# Patient Record
Sex: Male | Born: 1940 | Race: White | Hispanic: No | Marital: Married | Smoking: Never smoker
Health system: Southern US, Community
[De-identification: ages and names within clinical notes are randomized; demographics above are authoritative.]

## PROBLEM LIST (undated history)

## (undated) DIAGNOSIS — I10 Essential (primary) hypertension: Secondary | ICD-10-CM

## (undated) DIAGNOSIS — E7800 Pure hypercholesterolemia, unspecified: Secondary | ICD-10-CM

## (undated) HISTORY — DX: Essential (primary) hypertension: I10

## (undated) HISTORY — DX: Pure hypercholesterolemia, unspecified: E78.00

---

## 2006-08-22 ENCOUNTER — Inpatient Hospital Stay (HOSPITAL_COMMUNITY): Admission: RE | Admit: 2006-08-22 | Discharge: 2006-08-31 | Payer: Self-pay | Admitting: Psychiatry

## 2006-08-22 ENCOUNTER — Emergency Department (HOSPITAL_COMMUNITY): Admission: EM | Admit: 2006-08-22 | Discharge: 2006-08-22 | Payer: Self-pay | Admitting: Emergency Medicine

## 2006-08-22 ENCOUNTER — Ambulatory Visit: Payer: Self-pay | Admitting: Psychiatry

## 2010-07-08 ENCOUNTER — Encounter: Payer: Self-pay | Admitting: Family Medicine

## 2013-05-17 ENCOUNTER — Other Ambulatory Visit: Payer: Self-pay | Admitting: Family Medicine

## 2013-05-17 DIAGNOSIS — Z87891 Personal history of nicotine dependence: Secondary | ICD-10-CM

## 2013-05-20 ENCOUNTER — Ambulatory Visit
Admission: RE | Admit: 2013-05-20 | Discharge: 2013-05-20 | Disposition: A | Discharge status: Home / Self Care | Payer: Medicare Other | Source: Ambulatory Visit | Attending: Family Medicine | Admitting: Family Medicine

## 2013-05-20 DIAGNOSIS — Z87891 Personal history of nicotine dependence: Secondary | ICD-10-CM

## 2015-07-10 ENCOUNTER — Emergency Department (HOSPITAL_COMMUNITY)
Admission: EM | Admit: 2015-07-10 | Discharge: 2015-07-10 | Disposition: A | Discharge status: Home / Self Care | Payer: Medicare Other | Attending: Emergency Medicine | Admitting: Emergency Medicine

## 2015-07-10 ENCOUNTER — Emergency Department (HOSPITAL_COMMUNITY): Payer: Medicare Other

## 2015-07-10 ENCOUNTER — Encounter (HOSPITAL_COMMUNITY): Payer: Self-pay | Admitting: Emergency Medicine

## 2015-07-10 DIAGNOSIS — K5900 Constipation, unspecified: Secondary | ICD-10-CM | POA: Insufficient documentation

## 2015-07-10 DIAGNOSIS — R110 Nausea: Secondary | ICD-10-CM | POA: Diagnosis not present

## 2015-07-10 DIAGNOSIS — F419 Anxiety disorder, unspecified: Secondary | ICD-10-CM

## 2015-07-10 DIAGNOSIS — R109 Unspecified abdominal pain: Secondary | ICD-10-CM | POA: Diagnosis not present

## 2015-07-10 LAB — COMPREHENSIVE METABOLIC PANEL
ALBUMIN: 4.3 g/dL (ref 3.5–5.0)
ALT: 23 U/L (ref 17–63)
AST: 21 U/L (ref 15–41)
Alkaline Phosphatase: 96 U/L (ref 38–126)
Anion gap: 13 (ref 5–15)
BUN: 14 mg/dL (ref 6–20)
CHLORIDE: 104 mmol/L (ref 101–111)
CO2: 20 mmol/L — AB (ref 22–32)
Calcium: 9.5 mg/dL (ref 8.9–10.3)
Creatinine, Ser: 0.96 mg/dL (ref 0.61–1.24)
GFR calc Af Amer: >60 mL/min (ref >60)
GFR calc non Af Amer: >60 mL/min (ref >60)
GLUCOSE: 96 mg/dL (ref 65–99)
POTASSIUM: 3.5 mmol/L (ref 3.5–5.1)
Sodium: 137 mmol/L (ref 135–145)
Total Bilirubin: 1.3 mg/dL — ABNORMAL HIGH (ref 0.3–1.2)
Total Protein: 6.7 g/dL (ref 6.5–8.1)

## 2015-07-10 LAB — URINALYSIS, ROUTINE W REFLEX MICROSCOPIC
Bilirubin Urine: NEGATIVE
GLUCOSE, UA: NEGATIVE mg/dL
Hgb urine dipstick: NEGATIVE
Ketones, ur: 15 mg/dL — AB
LEUKOCYTES UA: NEGATIVE
Nitrite: NEGATIVE
PH: 7.5 (ref 5.0–8.0)
Protein, ur: NEGATIVE mg/dL
Specific Gravity, Urine: 1.006 (ref 1.005–1.030)

## 2015-07-10 LAB — CBC
HEMATOCRIT: 38.1 % — AB (ref 39.0–52.0)
Hemoglobin: 13.6 g/dL (ref 13.0–17.0)
MCH: 32.5 pg (ref 26.0–34.0)
MCHC: 35.7 g/dL (ref 30.0–36.0)
MCV: 91.1 fL (ref 78.0–100.0)
PLATELETS: 293 10*3/uL (ref 150–400)
RBC: 4.18 MIL/uL — ABNORMAL LOW (ref 4.22–5.81)
RDW: 13.1 % (ref 11.5–15.5)
WBC: 12.3 10*3/uL — AB (ref 4.0–10.5)

## 2015-07-10 LAB — LIPASE, BLOOD: Lipase: 33 U/L (ref 11–51)

## 2015-07-10 MED ORDER — ONDANSETRON HCL 4 MG/2ML IJ SOLN
4.0000 mg | Freq: Once | INTRAMUSCULAR | Status: AC
  Administered 2015-07-10: 4 mg via INTRAVENOUS
  Filled 2015-07-10: qty 2

## 2015-07-10 MED ORDER — IOHEXOL 300 MG/ML  SOLN
100.0000 mL | Freq: Once | INTRAMUSCULAR | Status: AC | PRN
  Administered 2015-07-10: 100 mL via INTRAVENOUS

## 2015-07-10 MED ORDER — LORAZEPAM 2 MG/ML IJ SOLN
1.0000 mg | Freq: Once | INTRAMUSCULAR | Status: AC
  Administered 2015-07-10: 1 mg via INTRAVENOUS
  Filled 2015-07-10: qty 1

## 2015-07-10 MED ORDER — IOHEXOL 300 MG/ML  SOLN
25.0000 mL | Freq: Once | INTRAMUSCULAR | Status: AC | PRN
  Administered 2015-07-10: 25 mL via ORAL

## 2015-07-10 MED ORDER — SODIUM CHLORIDE 0.9 % IV SOLN
1000.0000 mL | Freq: Once | INTRAVENOUS | Status: AC
  Administered 2015-07-10: 1000 mL via INTRAVENOUS

## 2015-07-10 MED ORDER — METOCLOPRAMIDE HCL 5 MG/ML IJ SOLN
10.0000 mg | Freq: Once | INTRAMUSCULAR | Status: AC
  Administered 2015-07-10: 10 mg via INTRAVENOUS
  Filled 2015-07-10: qty 2

## 2015-07-10 MED ORDER — MORPHINE SULFATE (PF) 4 MG/ML IV SOLN
6.0000 mg | Freq: Once | INTRAVENOUS | Status: AC
  Administered 2015-07-10: 6 mg via INTRAVENOUS
  Filled 2015-07-10: qty 2

## 2015-07-10 MED ORDER — SODIUM CHLORIDE 0.9 % IV SOLN
1000.0000 mL | INTRAVENOUS | Status: DC
  Administered 2015-07-10: 1000 mL via INTRAVENOUS

## 2015-07-10 MED ORDER — METOCLOPRAMIDE HCL 10 MG PO TABS: 10.0000 mg | ORAL_TABLET | Freq: Three times a day (TID) | ORAL | Status: DC | PRN

## 2015-07-10 MED ORDER — ONDANSETRON 8 MG PO TBDP: 8.0000 mg | ORAL_TABLET | Freq: Three times a day (TID) | ORAL | Status: DC | PRN

## 2015-07-10 NOTE — ED Notes (Signed)
Patient here from home with complaints of constipation x3 days ago. States that he is very stress and has severe anxiety. Also thinks that the constipation is related to new depression/anxiety medication. Reports that he needs to talk to someone about his depression/anxiety.

## 2015-07-10 NOTE — ED Notes (Signed)
Patient transported to CT 

## 2015-07-10 NOTE — ED Notes (Signed)
Bed: [id number] Expected date:  Expected time:  Means of arrival:  Comments: EMS 75yo M no BM in several days / anxiety / nauseous

## 2015-07-10 NOTE — Discharge Instructions (Signed)
Nausea, Adult °Nausea is the feeling that you have an upset stomach or have to vomit. Nausea by itself is not likely a serious concern, but it may be an early sign of more serious medical problems. As nausea gets worse, it can lead to vomiting. If vomiting develops, there is the risk of dehydration.  °CAUSES  °· Viral infections. °· Food poisoning. °· Medicines. °· Pregnancy. °· Motion sickness. °· Migraine headaches. °· Emotional distress. °· Severe pain from any source. °· Alcohol intoxication. °HOME CARE INSTRUCTIONS °· Get plenty of rest. °· Ask your caregiver about specific rehydration instructions. °· Eat small amounts of food and sip liquids more often. °· Take all medicines as told by your caregiver. °SEEK MEDICAL CARE IF: °· You have not improved after 2 days, or you get worse. °· You have a headache. °SEEK IMMEDIATE MEDICAL CARE IF:  °· You have a fever. °· You faint. °· You keep vomiting or have blood in your vomit. °· You are extremely weak or dehydrated. °· You have dark or bloody stools. °· You have severe chest or abdominal pain. °MAKE SURE YOU: °· Understand these instructions. °· Will watch your condition. °· Will get help right away if you are not doing well or get worse. °  °This information is not intended to replace advice given to you by your health care provider. Make sure you discuss any questions you have with your health care provider. °  °Document Released: 07/11/2004 Document Revised: 06/24/2014 Document Reviewed: 02/13/2011 °Elsevier Interactive Patient Education ©2016 Elsevier Inc. ° °

## 2015-07-14 NOTE — ED Provider Notes (Signed)
CSN: [id number]     Arrival date & time 07/10/15  0436 History   First MD Initiated Contact with Patient 07/10/15 [phone]     Chief Complaint  Patient presents with  . Constipation      HPI Patient is brought to the emergency department for complaints of constipation over the past 3 days.  He's been given enemas by his wife without any improvement in his symptoms.  He feels like he needs to have a bowel movement.  He reports he also feels very anxious.  He denies abdominal pain.  No chest pain shortness breath.  No fevers or chills.  He states he had a colonoscopy within the past 5 years which demonstrated only a polyp.  Denies blood in the stool.  No urinary complaints.  Symptoms are moderate in severity.  His wife reports that his anxiety is really began to peak even more over the past several weeks.    History reviewed. No pertinent past medical history. History reviewed. No pertinent past surgical history. History reviewed. No pertinent family history. Social History  Substance Use Topics  . Smoking status: Never Smoker   . Smokeless tobacco: None  . Alcohol Use: No    Review of Systems  All other systems reviewed and are negative.     Allergies  Review of patient's allergies indicates not on file.  Home Medications   Prior to Admission medications   Medication Sig Start Date End Date Taking? Authorizing Provider                 BP 109/78 mmHg  Pulse 85  Temp(Src) 98.4 F (36.9 C) (Oral)  Resp 17  SpO2 99% Physical Exam  Constitutional: He is oriented to person, place, and time. He appears well-developed and well-nourished.  HENT:  Head: Normocephalic and atraumatic.  Eyes: EOM are normal.  Neck: Normal range of motion.  Cardiovascular: Normal rate, regular rhythm, normal heart sounds and intact distal pulses.   Pulmonary/Chest: Effort normal and breath sounds normal. No respiratory distress.  Abdominal: Soft. He exhibits no distension. There is no tenderness.   Musculoskeletal: Normal range of motion.  Neurological: He is alert and oriented to person, place, and time.  Skin: Skin is warm and dry.  Psychiatric: He has a normal mood and affect. Judgment normal.  Nursing note and vitals reviewed.   ED Course  Procedures (including critical care time) Labs Review Labs Reviewed  CBC - Abnormal; Notable for the following:    WBC 12.3 (*)    RBC 4.18 (*)    HCT 38.1 (*)    All other components within normal limits  COMPREHENSIVE METABOLIC PANEL - Abnormal; Notable for the following:    CO2 20 (*)    Total Bilirubin 1.3 (*)    All other components within normal limits  URINALYSIS, ROUTINE W REFLEX MICROSCOPIC (NOT AT [facility]) - Abnormal; Notable for the following:    Ketones, ur 15 (*)    All other components within normal limits  LIPASE, BLOOD    Imaging Review CT abd/pelvis IMPRESSION: 1. Mild wall thickening noted along the rectum, with increased wall enhancement, raising question for an underlying mild infectious or inflammatory process. 2. Relatively small amount of stool and fluid noted in the colon, without CT evidence for constipation. 3. 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  I have personally reviewed and evaluated these images and lab results as part of my  medical decision-making.   EKG Interpretation None      MDM   Final diagnoses:  Nausea  Abdominal pain, unspecified abdominal location  Anxiety   The patient will be referred to GI for outpatient follow-up for his symptoms.  No indication for additional workup.    Jola Schmidt, MD 07/14/15 [phone]

## 2017-09-08 IMAGING — CT CT ABD-PELV W/ CM
2 of 5 series · 15 of 46 positions shown, 17 images · IV contrast (100 ML OMNI 300)
Comparison: Retroperitoneal ultrasound performed 05/20/2013

CLINICAL DATA: Acute onset of leukocytosis. No bowel movements for
3 days. Initial encounter.

EXAM:
CT ABDOMEN AND PELVIS WITH CONTRAST
TECHNIQUE: Multidetector CT imaging of the abdomen and pelvis was performed
using the standard protocol following bolus administration of
intravenous contrast.
CONTRAST:  100mL OMNIPAQUE IOHEXOL 300 MG/ML  SOLN

[Series 2: abd/pel with · axial · 0.70mm/px · z∈[+1001,+1396]mm · 12 of 89 slices shown, 14 images]
[im 5/89  soft-tissue]
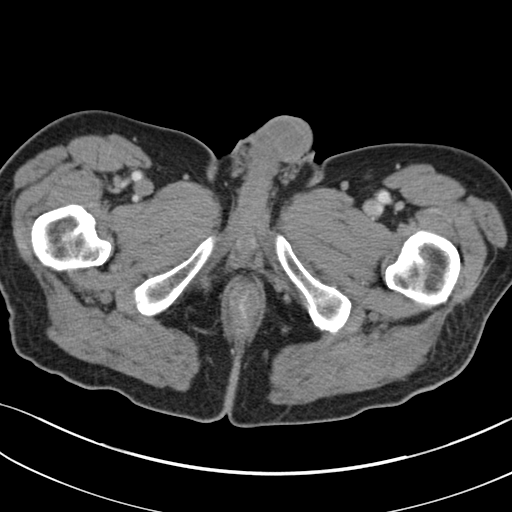
[im 5/89  bone]
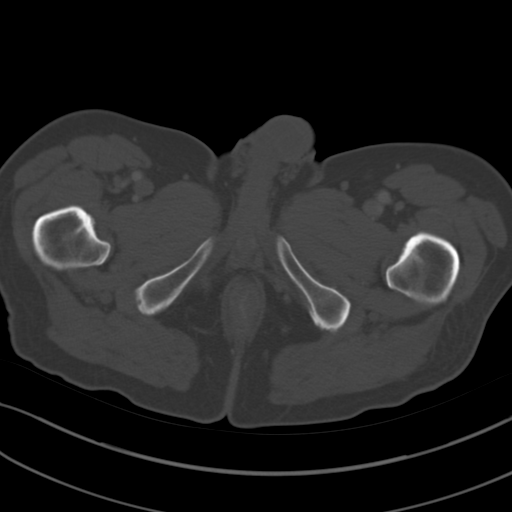
[im 14/89  soft-tissue]
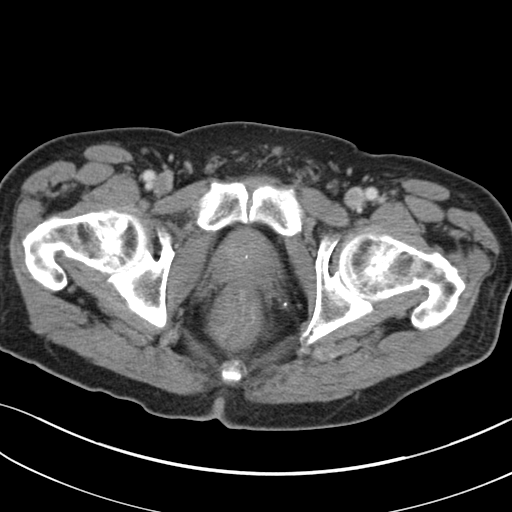
[im 18/89  soft-tissue]
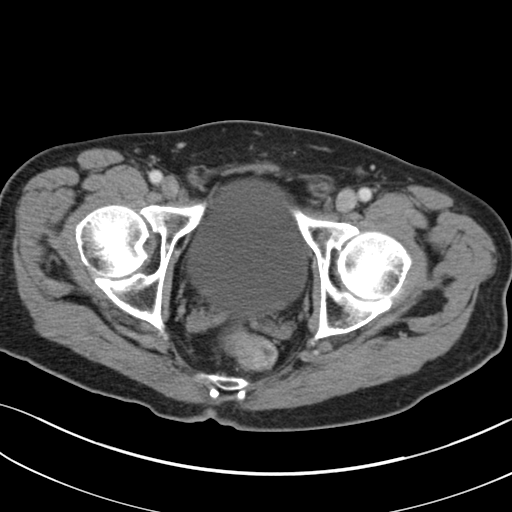
[im 27/89  soft-tissue]
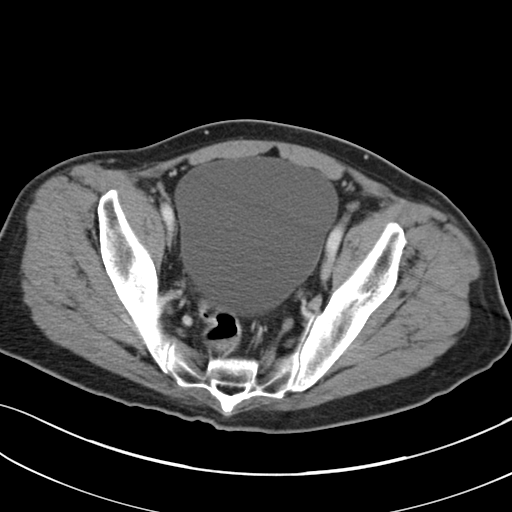
[im 36/89  soft-tissue]
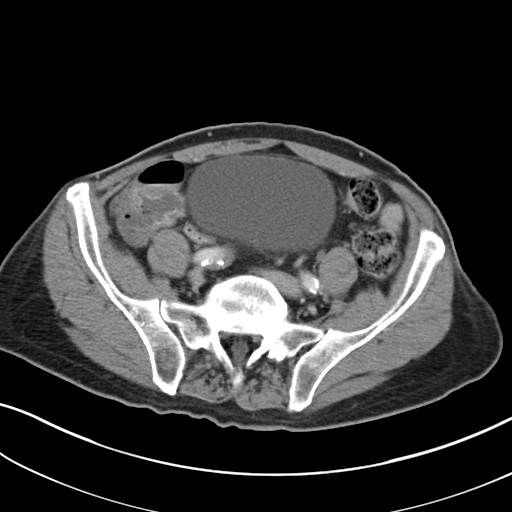
[im 40/89  soft-tissue]
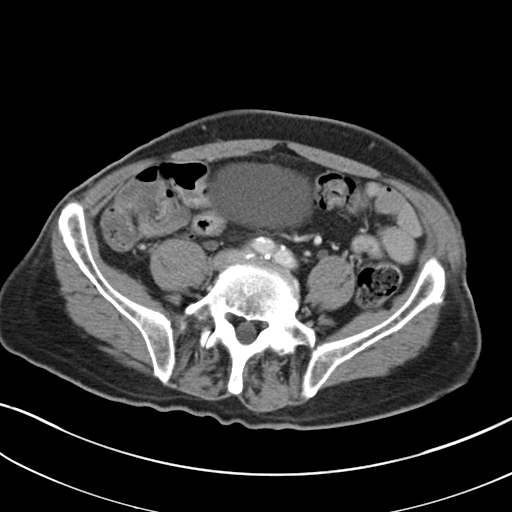
[im 49/89  soft-tissue]
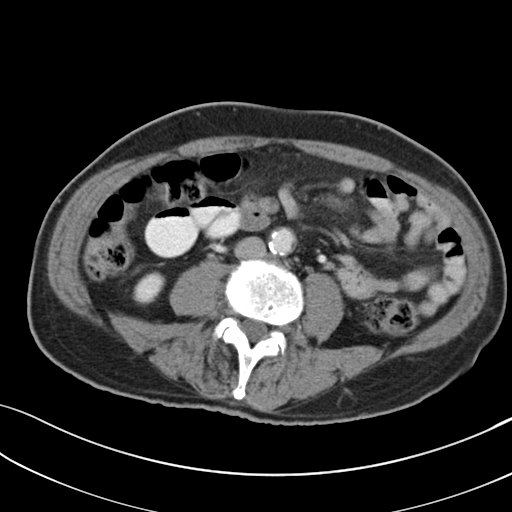
[im 53/89  soft-tissue]
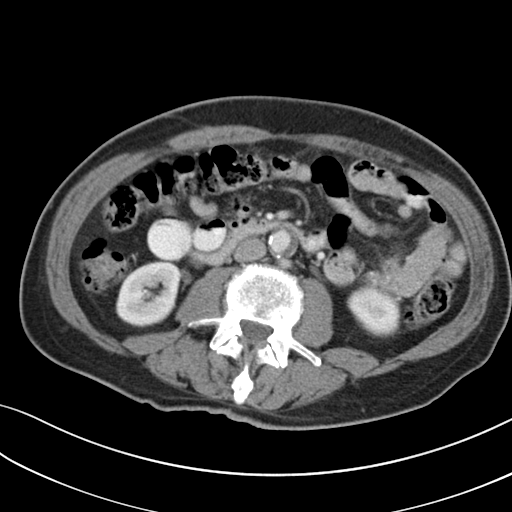
[im 62/89  soft-tissue]
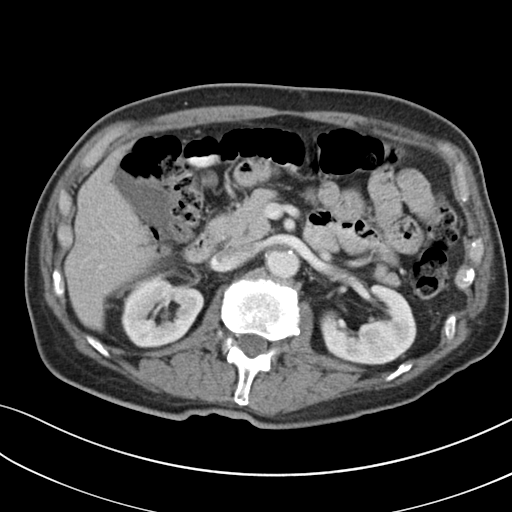
[im 62/89  bone]
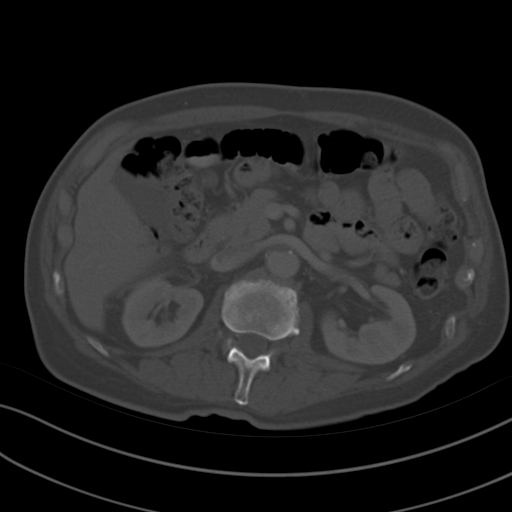
[im 71/89  soft-tissue]
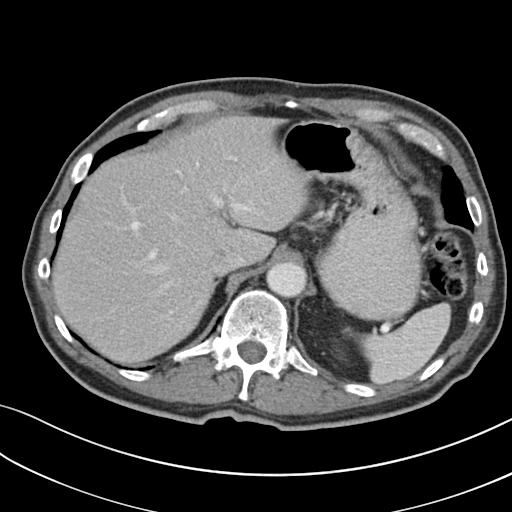
[im 75/89  soft-tissue]
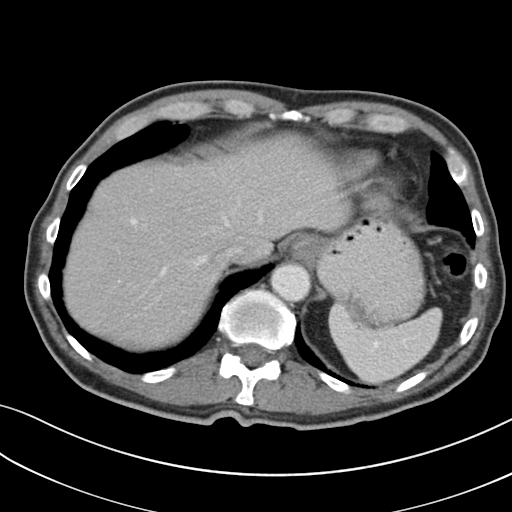
[im 84/89  soft-tissue]
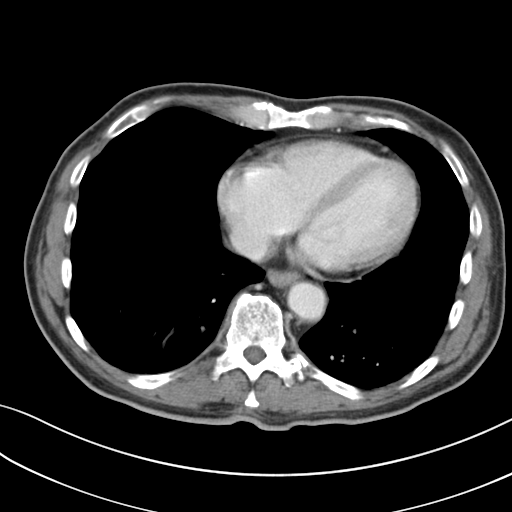

[Series 4: coronal a/|p · coronal · 0.65mm/px · 3 of 77 slices shown]
[im 26/77  soft-tissue]
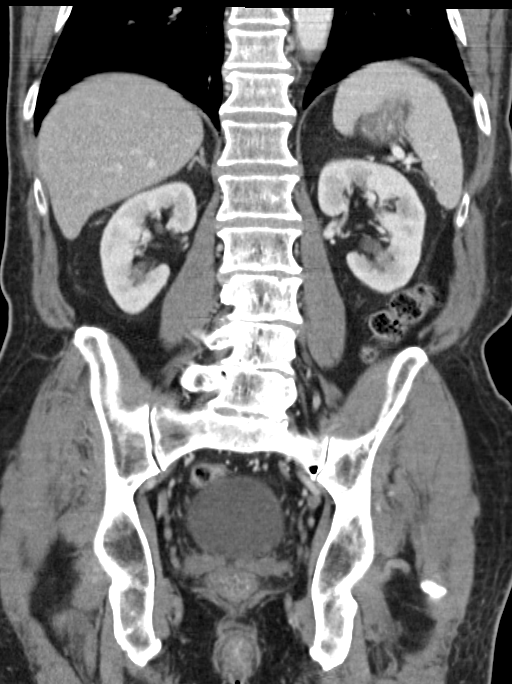
[im 34/77  soft-tissue]
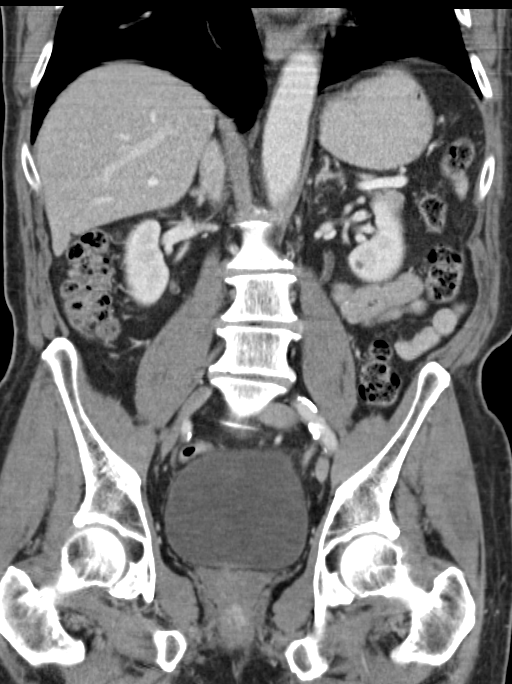
[im 43/77  soft-tissue]
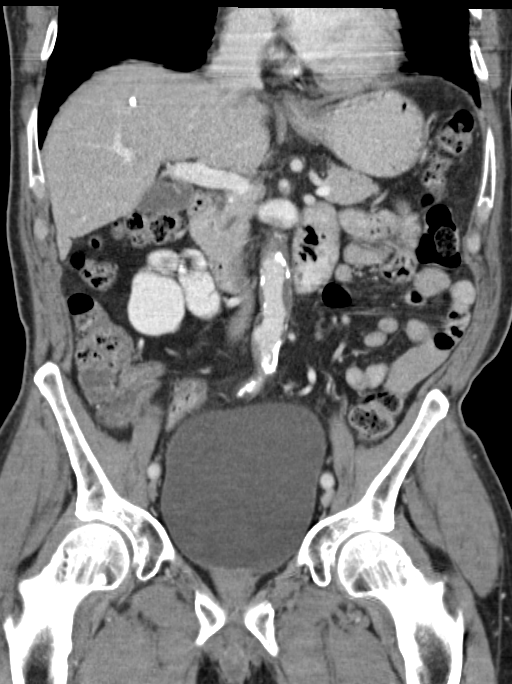

[15 of 46 positions shown; findings below may reference images not displayed]

FINDINGS: Minimal bibasilar atelectasis is noted.

A calcified granuloma is noted within the right hepatic lobe. The
liver is otherwise unremarkable. The spleen is unremarkable in
appearance. The gallbladder is within normal limits. The pancreas
and adrenal glands are unremarkable.

Mild bilateral renal pelvicaliectasis remains within normal limits.
The kidneys are unremarkable in appearance. There is no evidence of
hydronephrosis. No renal or ureteral stones are seen. No perinephric
stranding is appreciated.

No free fluid is identified. The small bowel is unremarkable in
appearance. The stomach is within normal limits. No acute vascular
abnormalities are seen. Scattered calcification is noted along the
abdominal aorta and branches. Mild mural thrombus is seen along the
infrarenal abdominal aorta.

The appendix is normal in caliber, without evidence of appendicitis.
The colon is unremarkable in appearance, containing a small amount
of stool. A small amount of fluid is seen within the sigmoid colon.
Mild wall thickening is noted along the rectum, with increased wall
enhancement, raising question for a mild infectious or inflammatory
process.

The bladder is significantly distended and grossly unremarkable. No
inguinal lymphadenopathy is seen.

No acute osseous abnormalities are identified.
IMPRESSION: 1. Mild wall thickening noted along the rectum, with increased wall
enhancement, raising question for an underlying mild infectious or
inflammatory process.
2. Relatively small amount of stool and fluid noted in the colon,
without CT evidence for constipation.
3. Scattered calcification along the abdominal aorta and its
branches, with mild mural thrombus along the infrarenal abdominal
aorta.

## 2019-07-06 ENCOUNTER — Ambulatory Visit: Payer: Medicare Other | Attending: Internal Medicine

## 2019-07-06 ENCOUNTER — Other Ambulatory Visit: Payer: Self-pay

## 2019-07-06 DIAGNOSIS — Z23 Encounter for immunization: Secondary | ICD-10-CM

## 2019-07-06 NOTE — Progress Notes (Signed)
   Covid-19 Vaccination Clinic  Name:  [name]    MRN: [id number] DOB: 04-07-1941  07/06/2019  [name] was observed post Covid-19 immunization for 15 minutes without incidence. He was provided with Vaccine Information Sheet and instruction to access the V-Safe system.   [name] was instructed to call 911 with any severe reactions post vaccine: [name] Kitchen Difficulty breathing  . Swelling of your face and throat  . A fast heartbeat  . A bad rash all over your body  . Dizziness and weakness    Immunizations Administered    Name Date Dose VIS Date Route   Pfizer COVID-19 Vaccine 07/06/2019 10:29 AM 0.3 mL 05/28/2019 Intramuscular   Manufacturer: [place]   Lot: [id number]   [place]: [id number]

## 2019-07-26 ENCOUNTER — Ambulatory Visit: Payer: Medicare Other | Attending: Internal Medicine

## 2019-07-26 DIAGNOSIS — Z23 Encounter for immunization: Secondary | ICD-10-CM

## 2019-07-26 NOTE — Progress Notes (Signed)
   Covid-19 Vaccination Clinic  Name:  John Nelson    MRN: [id number] DOB: 1940-10-14  07/26/2019  [name] was observed post Covid-19 immunization for 15 minutes without incidence. He was provided with Vaccine Information Sheet and instruction to access the V-Safe system.   [name] was instructed to call 911 with any severe reactions post vaccine: [name] Kitchen Difficulty breathing  . Swelling of your face and throat  . A fast heartbeat  . A bad rash all over your body  . Dizziness and weakness    Immunizations Administered    Name Date Dose VIS Date Route   Pfizer COVID-19 Vaccine 07/26/2019 10:14 AM 0.3 mL 05/28/2019 Intramuscular   Manufacturer: [place]   Lot: [id number]   [place] [address]: [id number]

## 2020-08-16 DIAGNOSIS — Z08 Encounter for follow-up examination after completed treatment for malignant neoplasm: Secondary | ICD-10-CM | POA: Diagnosis not present

## 2020-08-16 DIAGNOSIS — X32XXXD Exposure to sunlight, subsequent encounter: Secondary | ICD-10-CM | POA: Diagnosis not present

## 2020-08-16 DIAGNOSIS — L570 Actinic keratosis: Secondary | ICD-10-CM | POA: Diagnosis not present

## 2020-08-16 DIAGNOSIS — Z85828 Personal history of other malignant neoplasm of skin: Secondary | ICD-10-CM | POA: Diagnosis not present

## 2020-08-23 DIAGNOSIS — E7800 Pure hypercholesterolemia, unspecified: Secondary | ICD-10-CM | POA: Diagnosis not present

## 2020-08-23 DIAGNOSIS — I10 Essential (primary) hypertension: Secondary | ICD-10-CM | POA: Diagnosis not present

## 2020-08-23 DIAGNOSIS — M25511 Pain in right shoulder: Secondary | ICD-10-CM | POA: Diagnosis not present

## 2020-10-13 ENCOUNTER — Ambulatory Visit: Payer: Medicare Other | Attending: Internal Medicine

## 2020-10-13 ENCOUNTER — Other Ambulatory Visit (HOSPITAL_BASED_OUTPATIENT_CLINIC_OR_DEPARTMENT_OTHER): Payer: Self-pay

## 2020-10-13 DIAGNOSIS — Z23 Encounter for immunization: Secondary | ICD-10-CM

## 2020-10-13 MED ORDER — PFIZER-BIONT COVID-19 VAC-TRIS 30 MCG/0.3ML IM SUSP
INTRAMUSCULAR | 0 refills | Status: DC
  Filled 2020-10-13: qty 0.3, 1d supply, fill #0

## 2020-10-13 NOTE — Progress Notes (Signed)
   Covid-19 Vaccination Clinic  Name:  John Nelson    MRN: [id number] DOB: December 09, 1940  10/13/2020  Mr. John Nelson was observed post Covid-19 immunization for 15 minutes without incident. He was provided with Vaccine Information Sheet and instruction to access the V-Safe system.   Mr. John Nelson was instructed to call 911 with any severe reactions post vaccine: [name] Kitchen Difficulty breathing  . Swelling of face and throat  . A fast heartbeat  . A bad rash all over body  . Dizziness and weakness   Immunizations Administered    Name Date Dose VIS Date Route   PFIZER Comrnaty(Gray TOP) Covid-19 Vaccine 10/13/2020  1:56 PM 0.3 mL 05/25/2020 Intramuscular   Manufacturer: [place]   Lot: [id number]   [facility]: [phone]

## 2021-02-06 ENCOUNTER — Other Ambulatory Visit (HOSPITAL_BASED_OUTPATIENT_CLINIC_OR_DEPARTMENT_OTHER): Payer: Self-pay

## 2021-03-01 DIAGNOSIS — Z23 Encounter for immunization: Secondary | ICD-10-CM | POA: Diagnosis not present

## 2021-03-01 DIAGNOSIS — Z0000 Encounter for general adult medical examination without abnormal findings: Secondary | ICD-10-CM | POA: Diagnosis not present

## 2021-03-01 DIAGNOSIS — E7800 Pure hypercholesterolemia, unspecified: Secondary | ICD-10-CM | POA: Diagnosis not present

## 2021-03-01 DIAGNOSIS — Z1211 Encounter for screening for malignant neoplasm of colon: Secondary | ICD-10-CM | POA: Diagnosis not present

## 2021-03-01 DIAGNOSIS — I10 Essential (primary) hypertension: Secondary | ICD-10-CM | POA: Diagnosis not present

## 2021-03-05 DIAGNOSIS — Z1211 Encounter for screening for malignant neoplasm of colon: Secondary | ICD-10-CM | POA: Diagnosis not present

## 2021-03-23 DIAGNOSIS — H2513 Age-related nuclear cataract, bilateral: Secondary | ICD-10-CM | POA: Diagnosis not present

## 2021-03-23 DIAGNOSIS — H43393 Other vitreous opacities, bilateral: Secondary | ICD-10-CM | POA: Diagnosis not present

## 2021-04-19 ENCOUNTER — Ambulatory Visit: Payer: Medicare Other | Attending: Internal Medicine

## 2021-04-19 DIAGNOSIS — Z23 Encounter for immunization: Secondary | ICD-10-CM

## 2021-04-25 DIAGNOSIS — L570 Actinic keratosis: Secondary | ICD-10-CM | POA: Diagnosis not present

## 2021-04-25 DIAGNOSIS — X32XXXD Exposure to sunlight, subsequent encounter: Secondary | ICD-10-CM | POA: Diagnosis not present

## 2021-04-25 DIAGNOSIS — Z85820 Personal history of malignant melanoma of skin: Secondary | ICD-10-CM | POA: Diagnosis not present

## 2021-04-25 DIAGNOSIS — C44329 Squamous cell carcinoma of skin of other parts of face: Secondary | ICD-10-CM | POA: Diagnosis not present

## 2021-04-25 DIAGNOSIS — Z08 Encounter for follow-up examination after completed treatment for malignant neoplasm: Secondary | ICD-10-CM | POA: Diagnosis not present

## 2021-04-25 DIAGNOSIS — D225 Melanocytic nevi of trunk: Secondary | ICD-10-CM | POA: Diagnosis not present

## 2021-04-25 DIAGNOSIS — C4441 Basal cell carcinoma of skin of scalp and neck: Secondary | ICD-10-CM | POA: Diagnosis not present

## 2021-04-25 DIAGNOSIS — Z1283 Encounter for screening for malignant neoplasm of skin: Secondary | ICD-10-CM | POA: Diagnosis not present

## 2021-05-08 ENCOUNTER — Other Ambulatory Visit (HOSPITAL_BASED_OUTPATIENT_CLINIC_OR_DEPARTMENT_OTHER): Payer: Self-pay

## 2021-05-08 MED ORDER — PFIZER COVID-19 VAC BIVALENT 30 MCG/0.3ML IM SUSP
INTRAMUSCULAR | 0 refills | Status: DC
  Filled 2021-05-08: qty 0.3, 1d supply, fill #0

## 2021-08-08 DIAGNOSIS — Z08 Encounter for follow-up examination after completed treatment for malignant neoplasm: Secondary | ICD-10-CM | POA: Diagnosis not present

## 2021-08-08 DIAGNOSIS — L570 Actinic keratosis: Secondary | ICD-10-CM | POA: Diagnosis not present

## 2021-08-08 DIAGNOSIS — Z85828 Personal history of other malignant neoplasm of skin: Secondary | ICD-10-CM | POA: Diagnosis not present

## 2021-08-08 DIAGNOSIS — X32XXXD Exposure to sunlight, subsequent encounter: Secondary | ICD-10-CM | POA: Diagnosis not present

## 2021-08-30 DIAGNOSIS — I10 Essential (primary) hypertension: Secondary | ICD-10-CM | POA: Diagnosis not present

## 2021-08-30 DIAGNOSIS — E7800 Pure hypercholesterolemia, unspecified: Secondary | ICD-10-CM | POA: Diagnosis not present

## 2022-01-21 ENCOUNTER — Observation Stay (HOSPITAL_COMMUNITY)
Admission: EM | Admit: 2022-01-21 | Discharge: 2022-01-23 | Disposition: A | Discharge status: Home / Self Care | Payer: Medicare Other | Attending: Internal Medicine | Admitting: Internal Medicine

## 2022-01-21 ENCOUNTER — Emergency Department (HOSPITAL_COMMUNITY): Payer: Medicare Other

## 2022-01-21 ENCOUNTER — Other Ambulatory Visit: Payer: Self-pay

## 2022-01-21 ENCOUNTER — Encounter (HOSPITAL_COMMUNITY): Payer: Self-pay | Admitting: Emergency Medicine

## 2022-01-21 DIAGNOSIS — I493 Ventricular premature depolarization: Secondary | ICD-10-CM | POA: Diagnosis not present

## 2022-01-21 DIAGNOSIS — R42 Dizziness and giddiness: Secondary | ICD-10-CM | POA: Diagnosis not present

## 2022-01-21 DIAGNOSIS — I10 Essential (primary) hypertension: Secondary | ICD-10-CM | POA: Diagnosis not present

## 2022-01-21 DIAGNOSIS — R0602 Shortness of breath: Secondary | ICD-10-CM | POA: Diagnosis not present

## 2022-01-21 DIAGNOSIS — E785 Hyperlipidemia, unspecified: Secondary | ICD-10-CM

## 2022-01-21 DIAGNOSIS — Z79899 Other long term (current) drug therapy: Secondary | ICD-10-CM | POA: Diagnosis not present

## 2022-01-21 DIAGNOSIS — I491 Atrial premature depolarization: Secondary | ICD-10-CM | POA: Diagnosis not present

## 2022-01-21 DIAGNOSIS — R001 Bradycardia, unspecified: Secondary | ICD-10-CM | POA: Diagnosis not present

## 2022-01-21 DIAGNOSIS — F419 Anxiety disorder, unspecified: Secondary | ICD-10-CM

## 2022-01-21 LAB — CBC WITH DIFFERENTIAL/PLATELET
Abs Immature Granulocytes: 0.02 10*3/uL (ref 0.00–0.07)
Basophils Absolute: 0 10*3/uL (ref 0.0–0.1)
Basophils Relative: 0 %
Eosinophils Absolute: 0.1 10*3/uL (ref 0.0–0.5)
Eosinophils Relative: 2 %
HCT: 39.9 % (ref 39.0–52.0)
Hemoglobin: 13.8 g/dL (ref 13.0–17.0)
Immature Granulocytes: 0 %
Lymphocytes Relative: 12 %
Lymphs Abs: 0.9 10*3/uL (ref 0.7–4.0)
MCH: 33.5 pg (ref 26.0–34.0)
MCHC: 34.6 g/dL (ref 30.0–36.0)
MCV: 96.8 fL (ref 80.0–100.0)
Monocytes Absolute: 0.8 10*3/uL (ref 0.1–1.0)
Monocytes Relative: 11 %
Neutro Abs: 5.5 10*3/uL (ref 1.7–7.7)
Neutrophils Relative %: 75 %
Platelets: 220 10*3/uL (ref 150–400)
RBC: 4.12 MIL/uL — ABNORMAL LOW (ref 4.22–5.81)
RDW: 14 % (ref 11.5–15.5)
WBC: 7.3 10*3/uL (ref 4.0–10.5)
nRBC: 0 % (ref 0.0–0.2)

## 2022-01-21 LAB — HEPATIC FUNCTION PANEL
ALT: 18 U/L (ref 0–44)
AST: 20 U/L (ref 15–41)
Albumin: 4.1 g/dL (ref 3.5–5.0)
Alkaline Phosphatase: 71 U/L (ref 38–126)
Bilirubin, Direct: <0.1 mg/dL (ref 0.0–0.2)
Total Bilirubin: 0.9 mg/dL (ref 0.3–1.2)
Total Protein: 7.1 g/dL (ref 6.5–8.1)

## 2022-01-21 LAB — URINALYSIS, ROUTINE W REFLEX MICROSCOPIC
Bilirubin Urine: NEGATIVE
Glucose, UA: NEGATIVE mg/dL
Hgb urine dipstick: NEGATIVE
Ketones, ur: NEGATIVE mg/dL
Leukocytes,Ua: NEGATIVE
Nitrite: NEGATIVE
Protein, ur: NEGATIVE mg/dL
Specific Gravity, Urine: 1.005 (ref 1.005–1.030)
pH: 7 (ref 5.0–8.0)

## 2022-01-21 LAB — MAGNESIUM: Magnesium: 2 mg/dL (ref 1.7–2.4)

## 2022-01-21 LAB — BASIC METABOLIC PANEL
Anion gap: 8 (ref 5–15)
BUN: 14 mg/dL (ref 8–23)
CO2: 22 mmol/L (ref 22–32)
Calcium: 9.2 mg/dL (ref 8.9–10.3)
Chloride: 108 mmol/L (ref 98–111)
Creatinine, Ser: 1.21 mg/dL (ref 0.61–1.24)
GFR, Estimated: 60 mL/min — ABNORMAL LOW (ref >60)
Glucose, Bld: 104 mg/dL — ABNORMAL HIGH (ref 70–99)
Potassium: 4.3 mmol/L (ref 3.5–5.1)
Sodium: 138 mmol/L (ref 135–145)

## 2022-01-21 LAB — TSH: TSH: 2.161 u[IU]/mL (ref 0.350–4.500)

## 2022-01-21 LAB — TROPONIN I (HIGH SENSITIVITY): Troponin I (High Sensitivity): 5 ng/L (ref <18)

## 2022-01-21 LAB — T4, FREE: Free T4: 1.08 ng/dL (ref 0.61–1.12)

## 2022-01-21 MED ORDER — CLONAZEPAM 0.5 MG PO TABS: 0.2500 mg | ORAL_TABLET | ORAL | Status: DC

## 2022-01-21 MED ORDER — CLONAZEPAM 0.125 MG PO TBDP: 0.2500 mg | ORAL_TABLET | Freq: Every day | ORAL | Status: DC | PRN

## 2022-01-21 MED ORDER — PROCHLORPERAZINE EDISYLATE 10 MG/2ML IJ SOLN: 10.0000 mg | Freq: Four times a day (QID) | INTRAMUSCULAR | Status: DC | PRN

## 2022-01-21 MED ORDER — PRAVASTATIN SODIUM 20 MG PO TABS
20.0000 mg | ORAL_TABLET | Freq: Every day | ORAL | Status: DC
  Administered 2022-01-21 – 2022-01-22 (×2): 20 mg via ORAL
  Filled 2022-01-21 (×2): qty 1

## 2022-01-21 MED ORDER — CLONAZEPAM 0.125 MG PO TBDP
0.2500 mg | ORAL_TABLET | Freq: Two times a day (BID) | ORAL | Status: DC
  Administered 2022-01-21 – 2022-01-22 (×2): 0.25 mg via ORAL
  Filled 2022-01-21 (×2): qty 2

## 2022-01-21 MED ORDER — ENOXAPARIN SODIUM 40 MG/0.4ML IJ SOSY
40.0000 mg | PREFILLED_SYRINGE | INTRAMUSCULAR | Status: DC
  Administered 2022-01-21 – 2022-01-22 (×2): 40 mg via SUBCUTANEOUS
  Filled 2022-01-21 (×2): qty 0.4

## 2022-01-21 MED ORDER — HYDRALAZINE HCL 20 MG/ML IJ SOLN: 10.0000 mg | Freq: Three times a day (TID) | INTRAMUSCULAR | Status: DC | PRN

## 2022-01-21 MED ORDER — ASPIRIN 81 MG PO TBEC
81.0000 mg | DELAYED_RELEASE_TABLET | ORAL | Status: DC
  Administered 2022-01-22: 81 mg via ORAL
  Filled 2022-01-21: qty 1

## 2022-01-21 NOTE — H&P (Signed)
History and Physical    Patient: Vivek Grealish SNK:[id number] DOB: 1941/02/03 DOA: 01/21/2022 DOS: the patient was seen and examined on 01/21/2022 PCP: Shirline Frees, MD  Patient coming from: Home  Chief Complaint:  Chief Complaint  Patient presents with   Dizziness   HPI: Aleksa Catterton is a 81 y.o. male with medical history significant of HTN, HLD. Presenting with dizziness and nausea. He reports that he's noticed and up tick in his BP in the last 2 weeks. He has been checking with his home monitor. He has noticed during those checks that he HR is very low. He sees values in the 30s and low 40s. During this time, he has also noticed that when he tries to perform strenuous exercise, he gets lightheaded and nauseous. He denies any changes in meds. When his symptoms did not improve, he decided to come to the ED for help. He denies any other aggravating or alleviating factors.   Review of Systems: As mentioned in the history of present illness. All other systems reviewed and are negative. Past Medical History:  Diagnosis Date   High cholesterol    Hypertension    PSHx History reviewed. No pertinent surgical history.  Social History:  reports that he has never smoked. He does not have any smokeless tobacco history on file. He reports that he does not drink alcohol. No history on file for drug use.   NKDA  Fam Hx Reviewed. Non-contributory.  Prior to Admission medications   Medication Sig Start Date End Date Taking? Authorizing Provider  COVID-19 mRNA bivalent vaccine, Pfizer, (PFIZER COVID-19 VAC BIVALENT) injection Inject into the muscle. 04/19/21   Carlyle Basques, MD  COVID-19 mRNA Vac-TriS, Pfizer, (PFIZER-BIONT COVID-19 VAC-TRIS) SUSP injection Inject into the muscle. 10/13/20   Carlyle Basques, MD  metoCLOPramide (REGLAN) 10 MG tablet Take 1 tablet (10 mg total) by mouth every 8 (eight) hours as needed for nausea. 07/10/15   Jola Schmidt, MD  ondansetron (ZOFRAN ODT) 8 MG  disintegrating tablet Take 1 tablet (8 mg total) by mouth every 8 (eight) hours as needed for nausea or vomiting. 07/10/15   Jola Schmidt, MD    Physical Exam: Vitals:   01/21/22 1206 01/21/22 1208 01/21/22 1240 01/21/22 1321  BP:   (!) 195/105 (!) 150/82  Pulse:  (!) 35 (!) 51 62  Resp:  13 (!) 9 15  Temp:      TempSrc:      SpO2:  98% 99% 100%  Weight: 81.2 kg     Height: '5\' 7"'$  (1.702 m)      General: 81 y.o. male resting in bed in NAD Eyes: PERRL, normal sclera ENMT: Nares patent w/o discharge, orophaynx clear, dentition normal, ears w/o discharge/lesions/ulcers Neck: Supple, trachea midline Cardiovascular: brady, +S1, S2, no m/g/r, equal pulses throughout Respiratory: CTABL, no w/r/r, normal WOB GI: BS+, NDNT, no masses noted, no organomegaly noted MSK: No e/c/c Neuro: A&O x 3, no focal deficits Psyc: Appropriate interaction and affect, calm/cooperative   Data Reviewed:  Lab Results  Component Value Date   NA 138 01/21/2022   K 4.3 01/21/2022   CO2 22 01/21/2022   GLUCOSE 104 (H) 01/21/2022   BUN 14 01/21/2022   CREATININE 1.21 01/21/2022   CALCIUM 9.2 01/21/2022   GFRNONAA 60 (L) 01/21/2022   Lab Results  Component Value Date   ALT 18 01/21/2022   AST 20 01/21/2022   ALKPHOS 71 01/21/2022   BILITOT 0.9 01/21/2022   Trp:  5 Mg2+  2.0  Lab Results  Component Value Date   WBC 7.3 01/21/2022   HGB 13.8 01/21/2022   HCT 39.9 01/21/2022   MCV 96.8 01/21/2022   PLT 220 01/21/2022   CXR: No active disease.  EKG: sinus w/ pvcs  Assessment and Plan: Symptomatic bradycardia     - place in obs, tele     - cardiology consulted, appreciate assistance     - hold propanolol     - echo ordered     - no chest pain, dyspnea; 1st trp is 5; trend  HTN     - hold home propanolol     - will have PRN hydralazine available     - will see what echo shows before decision on long term anti-hypertensive  HLD     - continue home regimen  Anxiety     - continue  home regimen  Advance Care Planning:   Code Status:FULL  Consults: Cardiology  Family Communication: w/ wife at bedside  Severity of Illness: The appropriate patient status for this patient is OBSERVATION. Observation status is judged to be reasonable and necessary in order to provide the required intensity of service to ensure the patient's safety. The patient's presenting symptoms, physical exam findings, and initial radiographic and laboratory data in the context of their medical condition is felt to place them at decreased risk for further clinical deterioration. Furthermore, it is anticipated that the patient will be medically stable for discharge from the hospital within 2 midnights of admission.   Author: Jonnie Finner, DO 01/21/2022 2:07 PM  For on call review [email or web address].

## 2022-01-21 NOTE — Plan of Care (Signed)

## 2022-01-21 NOTE — ED Triage Notes (Signed)
Pt states his HR has been low and BP high for a week. Also states he has been dizzy with any physical activity. Denies and SOB or pain.

## 2022-01-21 NOTE — Consult Note (Addendum)
Cardiology Consultation:   Patient ID: John Nelson MRN: [id number]; DOB: 17-Feb-1941  Admit date: 01/21/2022 Date of Consult: 01/21/2022  PCP:  John Frees, MD   [facility] Providers Cardiologist:  None        Patient Profile:   John Nelson is a 81 y.o. male with a hx of HTN, HLD, and is active with yard work who is being seen 01/21/2022 for the evaluation of bradycardia and HTN at the request of Dr John Nelson.  History of Present Illness:   Mr. John Nelson presented to [facility] ED for feeling dizzy with any physical activity.  Checking his HR was low in 30s and BP was elevated.  He has been told in the past  he had irregular HR and was on propranolol 10 mg.  On pravastatin for HLD.    Over last 2 weeks with activity he becomes nauseated, lightheaded and feel weak.  He will check BP and it is elevated and HR low.    Na 138, K+ 4.3 BUN 14 Cr 1.21 hs troponin 5 WBC 7.3, Hgb 13.8 plts 220 U/A clear,  PCXR NAD  EKG:  The EKG was personally reviewed and demonstrates:  SR 64 with PVCs and PACs and non specific T wave abnormalities. On second EKG incorrectly read by computer as atrial fib, is SR with freq PVCs once couplet.  Telemetry:  Telemetry was personally reviewed and demonstrates:  SR with PVCs, and per Dr. Harrell Gave  Past Medical History:  Diagnosis Date   High cholesterol    Hypertension     History reviewed. No pertinent surgical history.   Home Medications:  Prior to Admission medications   Medication Sig Start Date End Date Taking? Authorizing Provider  COVID-19 mRNA bivalent vaccine, Pfizer, (PFIZER COVID-19 VAC BIVALENT) injection Inject into the muscle. 04/19/21   Carlyle Basques, MD  COVID-19 mRNA Vac-TriS, Pfizer, (PFIZER-BIONT COVID-19 VAC-TRIS) SUSP injection Inject into the muscle. 10/13/20   Carlyle Basques, MD  metoCLOPramide (REGLAN) 10 MG tablet Take 1 tablet (10 mg total) by mouth every 8 (eight) hours as needed for nausea. 07/10/15   Jola Schmidt, MD   ondansetron (ZOFRAN ODT) 8 MG disintegrating tablet Take 1 tablet (8 mg total) by mouth every 8 (eight) hours as needed for nausea or vomiting. 07/10/15   Jola Schmidt, MD  New additions are pravachol 40 and inderal 20 in AM and 10 if needed in evening for elevated BP along with asa 81 mg and klonipin 0.5 mg prn   Inpatient Medications: Scheduled Meds:  Continuous Infusions:  PRN Meds:   Allergies:   No Known Allergies  Social History:   Social History   Socioeconomic History   Marital status: Married    Spouse name: Not on file   Number of children: Not on file   Years of education: Not on file   Highest education level: Not on file  Occupational History   Not on file  Tobacco Use   Smoking status: Never   Smokeless tobacco: Not on file  Substance and Sexual Activity   Alcohol use: No   Drug use: Not on file   Sexual activity: Not on file  Other Topics Concern   Not on file  Social History Narrative   Not on file   Social Determinants of Health   Financial Resource Strain: Not on file  Food Insecurity: Not on file  Transportation Needs: Not on file  Physical Activity: Not on file  Stress: Not on file  Social Connections: Not on  file  Intimate Partner Violence: Not on file    Family History:   No family history on file.   ROS:  Please see the history of present illness.  General:no colds or fevers, no weight changes Skin:no rashes or ulcers HEENT:no blurred vision, no congestion CV:see HPI PUL:see HPI GI:no diarrhea constipation or melena, no indigestion GU:no hematuria, no dysuria MS:no joint pain, no claudication Neuro:no syncope, no lightheadedness Endo:no diabetes, no thyroid disease  All other ROS reviewed and negative.     Physical Exam/Data:   Vitals:   01/21/22 1330 01/21/22 1400 01/21/22 1432 01/21/22 1500  BP: 126/82 (!) 141/71 (!) 149/95 (!) 151/83  Pulse: (!) 49 (!) 38 (!) 55 61  Resp: '20 15 16 16  '$ Temp:      TempSrc:      SpO2:  96% 97% 100% 97%  Weight:      Height:       No intake or output data in the 24 hours ending 01/21/22 1518    01/21/2022   12:06 PM  Last 3 Weights  Weight (lbs) 179 lb  Weight (kg) 81.194 kg     Body mass index is 28.04 kg/m.   Exam per Dr. Harrell Gave     Relevant CV Studies: Check echo  Laboratory Data:  High Sensitivity Troponin:   Recent Labs  Lab 01/21/22 1246  TROPONINIHS 5     Chemistry Recent Labs  Lab 01/21/22 1225 01/21/22 1411  NA 138  --   K 4.3  --   CL 108  --   CO2 22  --   GLUCOSE 104*  --   BUN 14  --   CREATININE 1.21  --   CALCIUM 9.2  --   MG  --  2.0  GFRNONAA 60*  --   ANIONGAP 8  --     Recent Labs  Lab 01/21/22 1411  PROT 7.1  ALBUMIN 4.1  AST 20  ALT 18  ALKPHOS 71  BILITOT 0.9   Lipids No results for input(s): "CHOL", "TRIG", "HDL", "LABVLDL", "LDLCALC", "CHOLHDL" in the last 168 hours.  Hematology Recent Labs  Lab 01/21/22 1225  WBC 7.3  RBC 4.12*  HGB 13.8  HCT 39.9  MCV 96.8  MCH 33.5  MCHC 34.6  RDW 14.0  PLT 220   Thyroid  Recent Labs  Lab 01/21/22 1411  TSH 2.161    BNPNo results for input(s): "BNP", "PROBNP" in the last 168 hours.  DDimer No results for input(s): "DDIMER" in the last 168 hours.   Radiology/Studies:  DG Chest Port 1 View  Result Date: 01/21/2022 CLINICAL DATA:  Shortness of breath, dizziness EXAM: PORTABLE CHEST 1 VIEW COMPARISON:  06/19/2015 FINDINGS: The heart size and mediastinal contours are within normal limits. Both lungs are clear. The visualized skeletal structures are unremarkable. IMPRESSION: No active disease. Electronically Signed   By: Elmer Picker M.D.   On: 01/21/2022 12:56     Assessment and Plan:   Weakness, fatigue, SOB, nausea with exertion. Neg Troponin X 1, no acute ST changes on EKG.  + PVCs and PACs, he reports HR to 30s unsure if brady or freq PVCs causing low HR on BP cuff.  Propranolol is not listed on home meds but is listed on meds from [place]  office in March.  Now meds updated and was on 20 mg propranolol in AM and 10 in evening if need for BP. Would hold propranolol.  But per Dr. Harrell Gave depending on tele. Repeat  second troponin, check TSH, Mg+ K+ is stable. Will order echo. HTN has been elevated 195/105 per [place] note on propranolol and HCTZ  HLD on Pravastatin.   Risk Assessment/Risk Scores:                For questions or updates, please contact [place] Please consult [email or web address] for contact info under    Signed, Cecilie Kicks, NP  01/21/2022 3:18 PM

## 2022-01-21 NOTE — ED Provider Notes (Signed)
[facility] DEPT Provider Note   CSN: [id number] Arrival date & time: 01/21/22  1154     History  Chief Complaint  Patient presents with   Dizziness    John Nelson is a 81 y.o. male.  Patient is a 81 year old male who presents with low heart rate.  He has a history of hypertension and hyperlipidemia.  He states he is normally very active and does a lot of yard work.  Over the last 2 weeks, he has noted that when he tries to do any kind of exertional work, he gets nauseated and lightheaded and feels real weak.  He is noted that his blood pressure has been down into the 30s on his blood pressure monitor.  He is also noted that his blood pressure has been elevated over the last couple weeks.  He has not made any changes in his medications.  He does have a history of "irregular heartbeat", which he was previously told was not dangerous.  I suspect it may be PVCs.  He was placed on propanolol for this.  He is followed by his primary care doctor.  He does not currently see a cardiologist.  He denies any associated chest pain or tightness.  No shortness of breath.       Home Medications Prior to Admission medications   Medication Sig Start Date End Date Taking? Authorizing Provider  COVID-19 mRNA bivalent vaccine, Pfizer, (PFIZER COVID-19 VAC BIVALENT) injection Inject into the muscle. 04/19/21   Carlyle Basques, MD  COVID-19 mRNA Vac-TriS, Pfizer, (PFIZER-BIONT COVID-19 VAC-TRIS) SUSP injection Inject into the muscle. 10/13/20   Carlyle Basques, MD  metoCLOPramide (REGLAN) 10 MG tablet Take 1 tablet (10 mg total) by mouth every 8 (eight) hours as needed for nausea. 07/10/15   Jola Schmidt, MD  ondansetron (ZOFRAN ODT) 8 MG disintegrating tablet Take 1 tablet (8 mg total) by mouth every 8 (eight) hours as needed for nausea or vomiting. 07/10/15   Jola Schmidt, MD      Allergies    Patient has no allergy information on record.    Review of Systems   Review of  Systems  Constitutional:  Positive for fatigue. Negative for chills, diaphoresis and fever.  HENT:  Negative for congestion, rhinorrhea and sneezing.   Eyes: Negative.   Respiratory:  Negative for cough, chest tightness and shortness of breath.   Cardiovascular:  Negative for chest pain and leg swelling.  Gastrointestinal:  Positive for nausea. Negative for abdominal pain, blood in stool, diarrhea and vomiting.  Genitourinary:  Negative for difficulty urinating, flank pain, frequency and hematuria.  Musculoskeletal:  Negative for arthralgias and back pain.  Skin:  Negative for rash.  Neurological:  Positive for light-headedness. Negative for dizziness, speech difficulty, weakness, numbness and headaches.    Physical Exam Updated Vital Signs BP (!) 150/82 (BP Location: Left Arm)   Pulse 62   Temp 98.3 F (36.8 C) (Oral)   Resp 15   Ht '5\' 7"'$  (1.702 m)   Wt 81.2 kg   SpO2 100%   BMI 28.04 kg/m  Physical Exam Constitutional:      Appearance: He is well-developed.  HENT:     Head: Normocephalic and atraumatic.  Eyes:     Pupils: Pupils are equal, round, and reactive to light.  Cardiovascular:     Rate and Rhythm: Normal rate and regular rhythm.     Heart sounds: Normal heart sounds.  Pulmonary:     Effort: Pulmonary effort is normal.  No respiratory distress.     Breath sounds: Normal breath sounds. No wheezing or rales.  Chest:     Chest wall: No tenderness.  Abdominal:     General: Bowel sounds are normal.     Palpations: Abdomen is soft.     Tenderness: There is no abdominal tenderness. There is no guarding or rebound.  Musculoskeletal:        General: Normal range of motion.     Cervical back: Normal range of motion and neck supple.     Comments: No edema or calf tenderness  Lymphadenopathy:     Cervical: No cervical adenopathy.  Skin:    General: Skin is warm and dry.     Findings: No rash.  Neurological:     Mental Status: He is alert and oriented to person,  place, and time.     ED Results / Procedures / Treatments   Labs (all labs ordered are listed, but only abnormal results are displayed) Labs Reviewed  BASIC METABOLIC PANEL - Abnormal; Notable for the following components:      Result Value   Glucose, Bld 104 (*)    GFR, Estimated 60 (*)    All other components within normal limits  CBC WITH DIFFERENTIAL/PLATELET - Abnormal; Notable for the following components:   RBC 4.12 (*)    All other components within normal limits  URINALYSIS, ROUTINE W REFLEX MICROSCOPIC - Abnormal; Notable for the following components:   Color, Urine STRAW (*)    All other components within normal limits  TSH  T4, FREE  MAGNESIUM  HEPATIC FUNCTION PANEL  TROPONIN I (HIGH SENSITIVITY)    EKG EKG Interpretation  Date/Time:  Monday January 21 2022 12:02:17 EDT Ventricular Rate:  64 PR Interval:  187 QRS Duration: 102 QT Interval:  405 QTC Calculation: 418 R Axis:   60 Text Interpretation: Sinus rhythm Multiform ventricular premature complexes Confirmed by Malvin Johns [phone]) on 01/21/2022 12:24:15 PM  Radiology DG Chest Port 1 View  Result Date: 01/21/2022 CLINICAL DATA:  Shortness of breath, dizziness EXAM: PORTABLE CHEST 1 VIEW COMPARISON:  06/19/2015 FINDINGS: The heart size and mediastinal contours are within normal limits. Both lungs are clear. The visualized skeletal structures are unremarkable. IMPRESSION: No active disease. Electronically Signed   By: Elmer Picker M.D.   On: 01/21/2022 12:56    Procedures Procedures    Medications Ordered in ED Medications - No data to display  ED Course/ Medical Decision Making/ A&P                           Medical Decision Making Amount and/or Complexity of Data Reviewed Labs: ordered. Radiology: ordered.  Risk Decision regarding hospitalization.   Patient is a 81 year old male who presents with episodes of weakness and dizziness with any exertion.  He does not have associated chest  pain or shortness of breath.  However he says his heart rates been down into the 30s intermittently over the last couple weeks.  His blood pressure has been on the high side.  His EKG was reviewed.  Sinus bradycardia with frequent PVCs.  He is having a lot of ectopy on the monitor but no runs of V. tach.  His labs are nonconcerning.  Chest x-ray which was interpreted by me and confirmed by the radiologist shows no acute abnormalities.  No pulmonary edema or pneumonia.  He is asymptomatic at rest.  I discussed with cardiology who will consult on the  patient.  I consulted with Dr. Marylyn Ishihara who will admit the patient for further treatment.  Final Clinical Impression(s) / ED Diagnoses Final diagnoses:  Lightheadedness  Bradycardia    Rx / DC Orders ED Discharge Orders     None         Malvin Johns, MD 01/21/22 [phone]

## 2022-01-22 ENCOUNTER — Encounter (HOSPITAL_COMMUNITY): Payer: Self-pay | Admitting: Internal Medicine

## 2022-01-22 ENCOUNTER — Observation Stay (HOSPITAL_BASED_OUTPATIENT_CLINIC_OR_DEPARTMENT_OTHER): Payer: Medicare Other

## 2022-01-22 DIAGNOSIS — R42 Dizziness and giddiness: Secondary | ICD-10-CM

## 2022-01-22 DIAGNOSIS — E7800 Pure hypercholesterolemia, unspecified: Secondary | ICD-10-CM

## 2022-01-22 DIAGNOSIS — R0609 Other forms of dyspnea: Secondary | ICD-10-CM | POA: Diagnosis not present

## 2022-01-22 DIAGNOSIS — F419 Anxiety disorder, unspecified: Secondary | ICD-10-CM

## 2022-01-22 DIAGNOSIS — I491 Atrial premature depolarization: Secondary | ICD-10-CM | POA: Diagnosis not present

## 2022-01-22 DIAGNOSIS — I10 Essential (primary) hypertension: Secondary | ICD-10-CM

## 2022-01-22 DIAGNOSIS — I493 Ventricular premature depolarization: Secondary | ICD-10-CM | POA: Diagnosis not present

## 2022-01-22 DIAGNOSIS — R001 Bradycardia, unspecified: Secondary | ICD-10-CM | POA: Diagnosis not present

## 2022-01-22 LAB — ECHOCARDIOGRAM COMPLETE
Calc EF: 53.4 %
Height: 67 in
S' Lateral: 3.2 cm
Single Plane A2C EF: 47.8 %
Single Plane A4C EF: 57.2 %
Weight: 2864 oz

## 2022-01-22 LAB — COMPREHENSIVE METABOLIC PANEL
ALT: 16 U/L (ref 0–44)
AST: 19 U/L (ref 15–41)
Albumin: 3.9 g/dL (ref 3.5–5.0)
Alkaline Phosphatase: 71 U/L (ref 38–126)
Anion gap: 7 (ref 5–15)
BUN: 19 mg/dL (ref 8–23)
CO2: 26 mmol/L (ref 22–32)
Calcium: 8.9 mg/dL (ref 8.9–10.3)
Chloride: 107 mmol/L (ref 98–111)
Creatinine, Ser: 1.5 mg/dL — ABNORMAL HIGH (ref 0.61–1.24)
GFR, Estimated: 46 mL/min — ABNORMAL LOW (ref >60)
Glucose, Bld: 98 mg/dL (ref 70–99)
Potassium: 3.9 mmol/L (ref 3.5–5.1)
Sodium: 140 mmol/L (ref 135–145)
Total Bilirubin: 0.7 mg/dL (ref 0.3–1.2)
Total Protein: 6.3 g/dL — ABNORMAL LOW (ref 6.5–8.1)

## 2022-01-22 LAB — CBC
HCT: 38.9 % — ABNORMAL LOW (ref 39.0–52.0)
Hemoglobin: 13 g/dL (ref 13.0–17.0)
MCH: 33 pg (ref 26.0–34.0)
MCHC: 33.4 g/dL (ref 30.0–36.0)
MCV: 98.7 fL (ref 80.0–100.0)
Platelets: 187 10*3/uL (ref 150–400)
RBC: 3.94 MIL/uL — ABNORMAL LOW (ref 4.22–5.81)
RDW: 13.7 % (ref 11.5–15.5)
WBC: 8.2 10*3/uL (ref 4.0–10.5)
nRBC: 0 % (ref 0.0–0.2)

## 2022-01-22 MED ORDER — METOPROLOL SUCCINATE ER 25 MG PO TB24
25.0000 mg | ORAL_TABLET | Freq: Every day | ORAL | Status: DC
Start: 2022-01-22 — End: 2022-01-23
  Administered 2022-01-23: 25 mg via ORAL
  Filled 2022-01-22 (×2): qty 1

## 2022-01-22 MED ORDER — SODIUM CHLORIDE 0.9 % IV SOLN: INTRAVENOUS | Status: DC

## 2022-01-22 NOTE — Progress Notes (Signed)
PROGRESS NOTE    John Nelson  HEN:[id number] DOB: [birth year] DOA: 01/21/2022 PCP: Shirline Frees, MD     Brief Narrative:  81 y.o. WM PMHx HTN, HLD.   Presenting with dizziness and nausea. He reports that he's noticed and up tick in his BP in the last 2 weeks. He has been checking with his home monitor. He has noticed during those checks that he HR is very low. He sees values in the 30s and low 40s. During this time, he has also noticed that when he tries to perform strenuous exercise, he gets lightheaded and nauseous. He denies any changes in meds. When his symptoms did not improve, he decided to come to the ED for help. He denies any other aggravating or alleviating factors.    Subjective: Afebrile overnight, improvement in HR, however remains bradycardic   Assessment & Plan: Covid vaccination;   Principal Problem:   Symptomatic bradycardia Active Problems:   HTN (hypertension)   HLD (hyperlipidemia)   Anxiety  Symptomatic bradycardia -Propranolol (hold home dose -8/8 per Cardiology  -Toprol  25 mg daily (hold), patient remains bradycardic  -Echocardiogram pending  -Cardiology signed off -Orthostatic vitals q shift  Essential HTN -See symptomatic bradycardia       AKI (baseline Cr 1.21) Lab Results  Component Value Date   CREATININE 1.50 (H) 01/22/2022   CREATININE 1.21 01/21/2022   CREATININE 0.96 07/10/2015  -Normal saline 44m/hr    HLD -Pravastatin 20 mg daily - Lipid panel pending     Anxiety -Clonazepam (home regimen)           Mobility Assessment (last 72 hours)     Mobility Assessment     Row Name 01/21/22 2200 01/21/22 1747         Does patient have an order for bedrest or is patient medically unstable No - Continue assessment No - Continue assessment      What is the highest level of mobility based on the progressive mobility assessment? Level 6 (Walks independently in room and hall) - Balance while walking in room without assist -  Complete Level 6 (Walks independently in room and hall) - Balance while walking in room without assist - Complete                       DVT prophylaxis: Lovenox Code Status: Full Family Communication: 8/8 wife and daughter at bedside for discussion of plan of care all questions answered Status is: Inpatient    Dispo: The patient is from: Home              Anticipated d/c is to: Home              Anticipated d/c date is: 1 day              Patient currently is not medically stable to d/c.      Consultants:  Cardiology    Procedures/Significant Events:  8/8 Echocardiogram (results pending)   I have personally reviewed and interpreted all radiology studies and my findings are as above.  VENTILATOR SETTINGS:    Cultures   Antimicrobials:    Devices    LINES / TUBES:      Continuous Infusions:   Objective: Vitals:   01/21/22 1729 01/21/22 2149 01/22/22 0158 01/22/22 0555  BP: (!) 159/77 (!) 149/79 128/68 (!) 140/90  Pulse: (!) 55 65 (!) 59 61  Resp: '18 17 17 17  '$ Temp: 98.2 F (36.8 C) (!) 97.5  F (36.4 C) 98.2 F (36.8 C) 97.7 F (36.5 C)  TempSrc: Oral Oral Oral Oral  SpO2: 100% 98% 96% 94%  Weight:      Height:        Intake/Output Summary (Last 24 hours) at 01/22/2022 0981 Last data filed at 01/22/2022 0600 Gross per 24 hour  Intake 50 ml  Output 0 ml  Net 50 ml   Filed Weights   01/21/22 1206  Weight: 81.2 kg    Examination:  General: A/O x 4 No acute respiratory distress Eyes: negative scleral hemorrhage, negative anisocoria, negative icterus ENT: Negative Runny nose, negative gingival bleeding, Neck:  Negative scars, masses, torticollis, lymphadenopathy, JVD Lungs: Clear to auscultation bilaterally without wheezes or crackles Cardiovascular: Regular rate and rhythm without murmur gallop or rub normal S1 and S2 Abdomen: negative abdominal pain, nondistended, positive soft, bowel sounds, no rebound, no ascites, no  appreciable mass Extremities: No significant cyanosis, clubbing, or edema bilateral lower extremities Skin: Negative rashes, lesions, ulcers Psychiatric:  Negative depression, negative anxiety, negative fatigue, negative mania  Central nervous system:  Cranial nerves II through XII intact, tongue/uvula midline, all extremities muscle strength 5/5, sensation intact throughout, negative dysarthria, negative expressive aphasia, negative receptive aphasia.  .     Data Reviewed: Care during the described time interval was provided by me .  I have reviewed this patient's available data, including medical history, events of note, physical examination, and all test results as part of my evaluation.  CBC: Recent Labs  Lab 01/21/22 1225 01/22/22 0324  WBC 7.3 8.2  NEUTROABS 5.5  --   HGB 13.8 13.0  HCT 39.9 38.9*  MCV 96.8 98.7  PLT 220 191   Basic Metabolic Panel: Recent Labs  Lab 01/21/22 1225 01/21/22 1411 01/22/22 0324  NA 138  --  140  K 4.3  --  3.9  CL 108  --  107  CO2 22  --  26  GLUCOSE 104*  --  98  BUN 14  --  19  CREATININE 1.21  --  1.50*  CALCIUM 9.2  --  8.9  MG  --  2.0  --    GFR: Estimated Creatinine Clearance: 38.7 mL/min (A) (by C-G formula based on SCr of 1.5 mg/dL (H)). Liver Function Tests: Recent Labs  Lab 01/21/22 1411 01/22/22 0324  AST 20 19  ALT 18 16  ALKPHOS 71 71  BILITOT 0.9 0.7  PROT 7.1 6.3*  ALBUMIN 4.1 3.9   No results for input(s): "LIPASE", "AMYLASE" in the last 168 hours. No results for input(s): "AMMONIA" in the last 168 hours. Coagulation Profile: No results for input(s): "INR", "PROTIME" in the last 168 hours. Cardiac Enzymes: No results for input(s): "CKTOTAL", "CKMB", "CKMBINDEX", "TROPONINI" in the last 168 hours. BNP (last 3 results) No results for input(s): "PROBNP" in the last 8760 hours. HbA1C: No results for input(s): "HGBA1C" in the last 72 hours. CBG: No results for input(s): "GLUCAP" in the last 168  hours. Lipid Profile: No results for input(s): "CHOL", "HDL", "LDLCALC", "TRIG", "CHOLHDL", "LDLDIRECT" in the last 72 hours. Thyroid Function Tests: Recent Labs    01/21/22 1411  TSH 2.161  FREET4 1.08   Anemia Panel: No results for input(s): "VITAMINB12", "FOLATE", "FERRITIN", "TIBC", "IRON", "RETICCTPCT" in the last 72 hours. Sepsis Labs: No results for input(s): "PROCALCITON", "LATICACIDVEN" in the last 168 hours.  No results found for this or any previous visit (from the past 240 hour(s)).       Radiology Studies:  DG Chest Port 1 View  Result Date: 01/21/2022 CLINICAL DATA:  Shortness of breath, dizziness EXAM: PORTABLE CHEST 1 VIEW COMPARISON:  06/19/2015 FINDINGS: The heart size and mediastinal contours are within normal limits. Both lungs are clear. The visualized skeletal structures are unremarkable. IMPRESSION: No active disease. Electronically Signed   By: Elmer Picker M.D.   On: 01/21/2022 12:56        Scheduled Meds:  aspirin EC  81 mg Oral QODAY   clonazepam  0.25 mg Oral BID   enoxaparin (LOVENOX) injection  40 mg Subcutaneous Q24H   pravastatin  20 mg Oral QHS   Continuous Infusions:   LOS: 0 days    Time spent:40 min    Nicholaos Schippers, Geraldo Docker, MD [facility]   If 7PM-7AM, please contact night-coverage 01/22/2022, 8:08 AM

## 2022-01-22 NOTE — Progress Notes (Signed)
  Transition of Care (TOC) Screening Note   Patient Details  Name: John Nelson Date of Birth: 1941/01/16   [facility] of [facility] CM/SW Contact:    Roseanne Kaufman, RN Phone Number: 01/22/2022, 4:30 PM    Transition of Care Department [facility]) has reviewed patient and no TOC needs have been identified at this time. We will continue to monitor patient advancement through interdisciplinary progression rounds. If new patient transition needs arise, please place a TOC consult.

## 2022-01-22 NOTE — Progress Notes (Signed)
  Echocardiogram 2D Echocardiogram has been performed.  John Nelson 01/22/2022, 9:51 AM

## 2022-01-22 NOTE — Progress Notes (Signed)
Progress Note  Patient Name: Benedict Kue Date of Encounter: 01/22/2022  [facility] Cardiologist: Buford Dresser, MD   Subjective   Feeling well. No chest pain, sob or palpitations.  Nauseated.   Inpatient Medications    Scheduled Meds:  aspirin EC  81 mg Oral QODAY   clonazepam  0.25 mg Oral BID   enoxaparin (LOVENOX) injection  40 mg Subcutaneous Q24H   pravastatin  20 mg Oral QHS   Continuous Infusions:  PRN Meds: clonazepam, hydrALAZINE, prochlorperazine   Vital Signs    Vitals:   01/21/22 1729 01/21/22 2149 01/22/22 0158 01/22/22 0555  BP: (!) 159/77 (!) 149/79 128/68 (!) 140/90  Pulse: (!) 55 65 (!) 59 61  Resp: '18 17 17 17  '$ Temp: 98.2 F (36.8 C) (!) 97.5 F (36.4 C) 98.2 F (36.8 C) 97.7 F (36.5 C)  TempSrc: Oral Oral Oral Oral  SpO2: 100% 98% 96% 94%  Weight:      Height:        Intake/Output Summary (Last 24 hours) at 01/22/2022 0803 Last data filed at 01/22/2022 0600 Gross per 24 hour  Intake 50 ml  Output 0 ml  Net 50 ml      01/21/2022   12:06 PM  Last 3 Weights  Weight (lbs) 179 lb  Weight (kg) 81.194 kg      Telemetry    SR, PAC, PVC - Personally Reviewed  ECG    N/a  Physical Exam   GEN: No acute distress.   Neck: No JVD Cardiac: RRR, no murmurs, rubs, or gallops.  Respiratory: Clear to auscultation bilaterally. GI: Soft, nontender, non-distended  MS: No edema; No deformity. Neuro:  Nonfocal  Psych: Normal affect   Labs    High Sensitivity Troponin:   Recent Labs  Lab 01/21/22 1246  TROPONINIHS 5     Chemistry Recent Labs  Lab 01/21/22 1225 01/21/22 1411 01/22/22 0324  NA 138  --  140  K 4.3  --  3.9  CL 108  --  107  CO2 22  --  26  GLUCOSE 104*  --  98  BUN 14  --  19  CREATININE 1.21  --  1.50*  CALCIUM 9.2  --  8.9  MG  --  2.0  --   PROT  --  7.1 6.3*  ALBUMIN  --  4.1 3.9  AST  --  20 19  ALT  --  18 16  ALKPHOS  --  71 71  BILITOT  --  0.9 0.7  GFRNONAA 60*  --  46*  ANIONGAP 8   --  7    Hematology Recent Labs  Lab 01/21/22 1225 01/22/22 0324  WBC 7.3 8.2  RBC 4.12* 3.94*  HGB 13.8 13.0  HCT 39.9 38.9*  MCV 96.8 98.7  MCH 33.5 33.0  MCHC 34.6 33.4  RDW 14.0 13.7  PLT 220 187   Thyroid  Recent Labs  Lab 01/21/22 1411  TSH 2.161  FREET4 1.08    Radiology    DG Chest Port 1 View  Result Date: 01/21/2022 CLINICAL DATA:  Shortness of breath, dizziness EXAM: PORTABLE CHEST 1 VIEW COMPARISON:  06/19/2015 FINDINGS: The heart size and mediastinal contours are within normal limits. Both lungs are clear. The visualized skeletal structures are unremarkable. IMPRESSION: No active disease. Electronically Signed   By: Elmer Picker M.D.   On: 01/21/2022 12:56    Cardiac Studies   Pending echo  Patient Profile  81 y.o. male with a hx of HTN, HLD, and is active with yard work who is being seen 01/21/2022 for the evaluation of bradycardia and HTN.   Assessment & Plan    Frequent PACs/multifocal PVCs - Symptomatic with straining activities but not walking suspecting vagal effect - Propranolol has been held - Electrolytes stable - TSH normal - Pending echo today. May need ischemic evaluation based on echo  2. HTN - Propranolol and HCTZ are on hold - BP gradually increasing>> likely need another agent  (consider amlodipine)  3. HLD - On pravastatin    For questions or updates, please contact [facility] Please consult [email or web address] for contact info under        SignedLeanor Kail, PA  01/22/2022, 8:03 AM

## 2022-01-22 NOTE — Discharge Instructions (Addendum)
Look into [employer] mobile (single lead version is fine) for monitoring your heart rhythm. Keep a log of blood pressure and symptoms, and we will review at your visit.

## 2022-01-23 DIAGNOSIS — R001 Bradycardia, unspecified: Secondary | ICD-10-CM | POA: Diagnosis not present

## 2022-01-23 LAB — CBC WITH DIFFERENTIAL/PLATELET
Abs Immature Granulocytes: 0.02 10*3/uL (ref 0.00–0.07)
Basophils Absolute: 0 10*3/uL (ref 0.0–0.1)
Basophils Relative: 0 %
Eosinophils Absolute: 0.1 10*3/uL (ref 0.0–0.5)
Eosinophils Relative: 2 %
HCT: 39.8 % (ref 39.0–52.0)
Hemoglobin: 13.3 g/dL (ref 13.0–17.0)
Immature Granulocytes: 0 %
Lymphocytes Relative: 19 %
Lymphs Abs: 1.5 10*3/uL (ref 0.7–4.0)
MCH: 32.4 pg (ref 26.0–34.0)
MCHC: 33.4 g/dL (ref 30.0–36.0)
MCV: 96.8 fL (ref 80.0–100.0)
Monocytes Absolute: 0.9 10*3/uL (ref 0.1–1.0)
Monocytes Relative: 12 %
Neutro Abs: 5.1 10*3/uL (ref 1.7–7.7)
Neutrophils Relative %: 67 %
Platelets: 197 10*3/uL (ref 150–400)
RBC: 4.11 MIL/uL — ABNORMAL LOW (ref 4.22–5.81)
RDW: 14 % (ref 11.5–15.5)
WBC: 7.6 10*3/uL (ref 4.0–10.5)
nRBC: 0 % (ref 0.0–0.2)

## 2022-01-23 LAB — LIPID PANEL
Cholesterol: 150 mg/dL (ref 0–200)
HDL: 36 mg/dL — ABNORMAL LOW (ref >40)
LDL Cholesterol: 97 mg/dL (ref 0–99)
Total CHOL/HDL Ratio: 4.2 RATIO
Triglycerides: 87 mg/dL (ref <150)
VLDL: 17 mg/dL (ref 0–40)

## 2022-01-23 LAB — COMPREHENSIVE METABOLIC PANEL
ALT: 15 U/L (ref 0–44)
AST: 17 U/L (ref 15–41)
Albumin: 3.8 g/dL (ref 3.5–5.0)
Alkaline Phosphatase: 62 U/L (ref 38–126)
Anion gap: 7 (ref 5–15)
BUN: 18 mg/dL (ref 8–23)
CO2: 24 mmol/L (ref 22–32)
Calcium: 8.8 mg/dL — ABNORMAL LOW (ref 8.9–10.3)
Chloride: 109 mmol/L (ref 98–111)
Creatinine, Ser: 1.15 mg/dL (ref 0.61–1.24)
GFR, Estimated: >60 mL/min (ref >60)
Glucose, Bld: 100 mg/dL — ABNORMAL HIGH (ref 70–99)
Potassium: 3.5 mmol/L (ref 3.5–5.1)
Sodium: 140 mmol/L (ref 135–145)
Total Bilirubin: 0.9 mg/dL (ref 0.3–1.2)
Total Protein: 6.5 g/dL (ref 6.5–8.1)

## 2022-01-23 LAB — MAGNESIUM: Magnesium: 1.8 mg/dL (ref 1.7–2.4)

## 2022-01-23 LAB — PHOSPHORUS: Phosphorus: 2.8 mg/dL (ref 2.5–4.6)

## 2022-01-23 MED ORDER — METOPROLOL SUCCINATE ER 25 MG PO TB24: 25.0000 mg | ORAL_TABLET | Freq: Every day | ORAL | 1 refills | Status: DC

## 2022-01-23 NOTE — Discharge Summary (Signed)
Physician Discharge Summary  John Nelson WNI:[id number] DOB: 15-Jun-1941 DOA: 01/21/2022  PCP: Shirline Frees, MD  Admit date: 01/21/2022 Discharge date: 01/23/2022  Admitted From: Home Disposition:  Home  Recommendations for Outpatient Follow-up:  Follow up with PCP in 1 week Follow up with Cardiology in few weeks   Discharge Condition: Stable CODE STATUS: Full  Diet recommendation: Heart healthy   Brief/Interim Summary: From H&P by Dr. Marylyn Ishihara: "John Nelson is a 81 y.o. male with medical history significant of HTN, HLD. Presenting with dizziness and nausea. He reports that he's noticed and up tick in his BP in the last 2 weeks. He has been checking with his home monitor. He has noticed during those checks that he HR is very low. He sees values in the 30s and low 40s. During this time, he has also noticed that when he tries to perform strenuous exercise, he gets lightheaded and nauseous. He denies any changes in meds. When his symptoms did not improve, he decided to come to the ED for help. He denies any other aggravating or alleviating factors."   Patient's propranolol was discontinued.  Cardiology was consulted.  Due to his PVC burden, he was switched to metoprolol.  Echocardiogram showed EF 55 to 60%, no regional wall motion abnormality, grade 1 diastolic dysfunction, very frequent monomorphic PVCs.  TSH normal.  Patient will follow-up closely with cardiology as outpatient.  Discharge Diagnoses:   Principal Problem:   Symptomatic bradycardia Active Problems:   HTN (hypertension)   HLD (hyperlipidemia)   Anxiety    Discharge Instructions  Discharge Instructions     Call MD for:  difficulty breathing, headache or visual disturbances   Complete by: As directed    Call MD for:  extreme fatigue   Complete by: As directed    Call MD for:  persistant dizziness or light-headedness   Complete by: As directed    Call MD for:  persistant nausea and vomiting   Complete by: As directed     Call MD for:  severe uncontrolled pain   Complete by: As directed    Call MD for:  temperature >100.4   Complete by: As directed    Diet - low sodium heart healthy   Complete by: As directed    Discharge instructions   Complete by: As directed    You were cared for by a hospitalist during your hospital stay. If you have any questions about your discharge medications or the care you received while you were in the hospital after you are discharged, you can call the unit and ask to speak with the hospitalist on call if the hospitalist that took care of you is not available. Once you are discharged, your primary care physician will handle any further medical issues. Please note that NO REFILLS for any discharge medications will be authorized once you are discharged, as it is imperative that you return to your primary care physician (or establish a relationship with a primary care physician if you do not have one) for your aftercare needs so that they can reassess your need for medications and monitor your lab values.   Increase activity slowly   Complete by: As directed       Allergies as of 01/23/2022   No Known Allergies      Medication List     STOP taking these medications    metoCLOPramide 10 MG tablet Commonly known as: REGLAN   ondansetron 8 MG disintegrating tablet Commonly known as: Zofran ODT  Pfizer COVID-19 Vac Bivalent injection Generic drug: COVID-19 mRNA bivalent vaccine Therapist, music)   Pfizer-BioNT COVID-19 Vac-TriS Susp injection Generic drug: COVID-19 mRNA Vac-TriS (Pfizer)   propranolol 10 MG tablet Commonly known as: INDERAL       TAKE these medications    aspirin EC 81 MG tablet Take 81 mg by mouth every other day. Swallow whole.   clonazePAM 0.5 MG tablet Commonly known as: KLONOPIN Take 0.25-0.5 mg by mouth See admin instructions. Take 0.25 mg at bedtime and at 2 AM- may take an additional 0.25-0.5 mg once a day as needed for anxiety   metoprolol  succinate 25 MG 24 hr tablet Commonly known as: TOPROL-XL Take 1 tablet (25 mg total) by mouth daily. Start taking on: January 24, 2022   pravastatin 40 MG tablet Commonly known as: PRAVACHOL Take 20 mg by mouth at bedtime.        Follow-up Information     Buford Dresser, MD Follow up.   Specialty: Cardiology Why: [place] at [facility] location - cardiology follow-up scheduled with Dr. Harrell Gave on Tuesday Feb 05, 2022 at 11:40 AM (Arrive by 11:25 AM). Contact information: [place] [address] [phone]         Shirline Frees, MD Follow up.   Specialty: Family Medicine Contact information: [place] [address] [phone]                No Known Allergies  Consultations: Cardiology    Procedures/Studies: ECHOCARDIOGRAM COMPLETE  Result Date: 01/22/2022    ECHOCARDIOGRAM REPORT   Patient Name:   [facility] Date of Exam: 01/22/2022 Medical Rec #:  [id number]     Height:       67.0 in Accession #:    [id number]    Weight:       179.0 lb Date of Birth:  1940-07-06     BSA:          1.929 m Patient Age:    20 years      BP:           140/90 mmHg Patient Gender: M             HR:           63 bpm. Exam Location:  Inpatient Procedure: 2D Echo, Cardiac Doppler and Color Doppler Indications:    Dyspnea R06.00  History:        Patient has no prior history of Echocardiogram examinations.                 Arrythmias:Bradycardia, Signs/Symptoms:Dyspnea; Risk                 Factors:Hypertension and Dyslipidemia.  Sonographer:    Bernadene Person RDCS Referring Phys: [id number] Jonnie Finner  Sonographer Comments: Image acquisition challenging due to respiratory motion. IMPRESSIONS  1. Left ventricular ejection fraction, by estimation, is 55 to 60%. The left ventricle has normal function. The left ventricle has no regional wall motion abnormalities. Left ventricular diastolic parameters are consistent with  Grade I diastolic dysfunction (impaired relaxation).  2. Right ventricular systolic function is normal. The right ventricular size is normal. Tricuspid regurgitation signal is inadequate for assessing PA pressure.  3. The mitral valve is normal in structure. Trivial mitral valve regurgitation.  4. The aortic valve is tricuspid. Aortic valve regurgitation is not visualized. No aortic stenosis is present.  5. The inferior vena cava is normal in size with greater than 50%  respiratory variability, suggesting right atrial pressure of 3 mmHg. 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 The PVCs are ineffective (there is virtually no left ventricular or right ventricular output with the PVCs). This may mimic bradycardia if a mechanical means of pulse counting is used (e.g. manual palpation, pulse oximetry, automatic sphygmomanometry, etc.). FINDINGS  Left Ventricle: Left ventricular ejection fraction, by estimation, is 55 to 60%. The left ventricle has normal function. The left ventricle has no regional wall motion abnormalities. The left ventricular internal cavity size was normal in size. There is  no left ventricular hypertrophy. Left ventricular diastolic parameters are consistent with Grade I diastolic dysfunction (impaired relaxation). Normal left ventricular filling pressure. Right Ventricle: The right ventricular size is normal. No increase in right ventricular wall thickness. Right ventricular systolic function is normal. Tricuspid regurgitation signal is inadequate for assessing PA pressure. Left Atrium: Left atrial size was normal in size. Right Atrium: Right atrial size was normal in size. Pericardium: There is no evidence of pericardial effusion. Mitral Valve: The mitral valve is normal in structure. Trivial mitral valve regurgitation. Tricuspid Valve: The tricuspid valve is normal in structure. Tricuspid valve regurgitation is  not demonstrated. Aortic Valve: The aortic valve is tricuspid. Aortic valve regurgitation is not visualized. No aortic stenosis is present. Pulmonic Valve: The pulmonic valve was grossly normal. Pulmonic valve regurgitation is trivial. Aorta: The aortic root and ascending aorta are structurally normal, with no evidence of dilitation. Venous: The inferior vena cava is normal in size with greater than 50% respiratory variability, suggesting right atrial pressure of 3 mmHg. IAS/Shunts: No atrial level shunt detected by color flow Doppler.  LEFT VENTRICLE PLAX 2D LVIDd:         4.50 cm      Diastology LVIDs:         3.20 cm      LV e' medial:    8.19 cm/s LV PW:         1.00 cm      LV E/e' medial:  7.7 LV IVS:        0.90 cm      LV e' lateral:   9.97 cm/s LVOT diam:     2.10 cm      LV E/e' lateral: 6.4 LV SV:         75 LV SV Index:   39 LVOT Area:     3.46 cm  LV Volumes (MOD) LV vol d, MOD A2C: 104.0 ml LV vol d, MOD A4C: 114.0 ml LV vol s, MOD A2C: 54.3 ml LV vol s, MOD A4C: 48.8 ml LV SV MOD A2C:     49.7 ml LV SV MOD A4C:     114.0 ml LV SV MOD BP:      59.0 ml RIGHT VENTRICLE RV S prime:     13.90 cm/s TAPSE (M-mode): 1.9 cm LEFT ATRIUM             Index        RIGHT ATRIUM           Index LA diam:        3.20 cm 1.66 cm/m   RA Area:     19.00 cm LA Vol (A2C):   53.3 ml 27.63 ml/m  RA Volume:   54.70 ml  28.35 ml/m LA Vol (A4C):   57.1 ml 29.60 ml/m LA Biplane Vol: 57.3 ml 29.70 ml/m  AORTIC VALVE LVOT Vmax:   102.50  cm/s LVOT Vmean:  68.233 cm/s LVOT VTI:    0.217 m  AORTA Ao Root diam: 3.50 cm Ao Asc diam:  3.60 cm MV E velocity: 63.41 cm/s MV A velocity: 87.83 cm/s  SHUNTS MV E/A ratio:  0.72        Systemic VTI:  0.22 m                            Systemic Diam: 2.10 cm Dani Gobble Croitoru MD Electronically signed by Sanda Klein MD Signature Date/Time: 01/22/2022/1:03:03 PM    Final    DG Chest Port 1 View  Result Date: 01/21/2022 CLINICAL DATA:  Shortness of breath, dizziness EXAM: PORTABLE CHEST 1  VIEW COMPARISON:  06/19/2015 FINDINGS: The heart size and mediastinal contours are within normal limits. Both lungs are clear. The visualized skeletal structures are unremarkable. IMPRESSION: No active disease. Electronically Signed   By: Elmer Picker M.D.   On: 01/21/2022 12:56       Discharge Exam: Vitals:   01/22/22 2220 01/23/22 0626  BP: 138/81 133/76  Pulse: 74 62  Resp: 14 16  Temp: 97.9 F (36.6 C) 98.3 F (36.8 C)  SpO2: 98% 98%    General: Pt is alert, awake, not in acute distress Cardiovascular: RRR rate 80 on telemetry during exam, S1/S2 +, no edema Respiratory: CTA bilaterally, no wheezing, no rhonchi, no respiratory distress, no conversational dyspnea  Abdominal: Soft, NT, ND, bowel sounds + Extremities: no edema, no cyanosis Psych: Normal mood and affect, stable judgement and insight     The results of significant diagnostics from this hospitalization (including imaging, microbiology, ancillary and laboratory) are listed below for reference.     Microbiology: No results found for this or any previous visit (from the past 240 hour(s)).   Labs: BNP (last 3 results) No results for input(s): "BNP" in the last 8760 hours. Basic Metabolic Panel: Recent Labs  Lab 01/21/22 1225 01/21/22 1411 01/22/22 0324 01/23/22 0352  NA 138  --  140 140  K 4.3  --  3.9 3.5  CL 108  --  107 109  CO2 22  --  26 24  GLUCOSE 104*  --  98 100*  BUN 14  --  19 18  CREATININE 1.21  --  1.50* 1.15  CALCIUM 9.2  --  8.9 8.8*  MG  --  2.0  --  1.8  PHOS  --   --   --  2.8   Liver Function Tests: Recent Labs  Lab 01/21/22 1411 01/22/22 0324 01/23/22 0352  AST '20 19 17  '$ ALT '18 16 15  '$ ALKPHOS 71 71 62  BILITOT 0.9 0.7 0.9  PROT 7.1 6.3* 6.5  ALBUMIN 4.1 3.9 3.8   No results for input(s): "LIPASE", "AMYLASE" in the last 168 hours. No results for input(s): "AMMONIA" in the last 168 hours. CBC: Recent Labs  Lab 01/21/22 1225 01/22/22 0324 01/23/22 0352  WBC  7.3 8.2 7.6  NEUTROABS 5.5  --  5.1  HGB 13.8 13.0 13.3  HCT 39.9 38.9* 39.8  MCV 96.8 98.7 96.8  PLT 220 187 197   Cardiac Enzymes: No results for input(s): "CKTOTAL", "CKMB", "CKMBINDEX", "TROPONINI" in the last 168 hours. BNP: Invalid input(s): "POCBNP" CBG: No results for input(s): "GLUCAP" in the last 168 hours. D-Dimer No results for input(s): "DDIMER" in the last 72 hours. Hgb A1c No results for input(s): "HGBA1C" in the last 72 hours. Lipid Profile Recent  Labs    01/23/22 0352  CHOL 150  HDL 36*  LDLCALC 97  TRIG 87  CHOLHDL 4.2   Thyroid function studies Recent Labs    01/21/22 1411  TSH 2.161   Anemia work up No results for input(s): "VITAMINB12", "FOLATE", "FERRITIN", "TIBC", "IRON", "RETICCTPCT" in the last 72 hours. Urinalysis    Component Value Date/Time   COLORURINE STRAW (A) 01/21/2022 1225   APPEARANCEUR CLEAR 01/21/2022 1225   LABSPEC 1.005 01/21/2022 1225   PHURINE 7.0 01/21/2022 1225   GLUCOSEU NEGATIVE 01/21/2022 1225   HGBUR NEGATIVE 01/21/2022 1225   BILIRUBINUR NEGATIVE 01/21/2022 1225   KETONESUR NEGATIVE 01/21/2022 1225   PROTEINUR NEGATIVE 01/21/2022 1225   NITRITE NEGATIVE 01/21/2022 1225   LEUKOCYTESUR NEGATIVE 01/21/2022 1225   Sepsis Labs Recent Labs  Lab 01/21/22 1225 01/22/22 0324 01/23/22 0352  WBC 7.3 8.2 7.6   Microbiology No results found for this or any previous visit (from the past 240 hour(s)).   Patient was seen and examined on the day of discharge and was found to be in stable condition. Time coordinating discharge: 45 minutes including assessment and coordination of care, as well as examination of the patient.   SIGNED:  Dessa Phi, DO [facility] 01/23/2022, 10:21 AM

## 2022-01-30 ENCOUNTER — Telehealth (HOSPITAL_BASED_OUTPATIENT_CLINIC_OR_DEPARTMENT_OTHER): Payer: Self-pay | Admitting: Cardiology

## 2022-01-30 NOTE — Telephone Encounter (Signed)
Returned call to patient, no answer, left message to call back.

## 2022-01-30 NOTE — Telephone Encounter (Signed)
Pt c/o medication issue:  1. Name of Medication: Metoprolol  2. How are you currently taking this medication (dosage and times per day)?Started off taking  1 a day, now staking 1 1/2 tablet a day   3. Are you having a reaction (difficulty breathing--STAT)?   4. What is your medication issue? Real nausea, nervous, weak  and blood pressure is going up, ca not get it down below 150

## 2022-01-30 NOTE — Telephone Encounter (Signed)
Returned call to patient to, patient feeling week and tired. Patient is prescribed Metoprolol Succinate '25mg'$  daily. Patient has been taking 1.5 tablets due to elevated blood pressure 180/90. Patient did report feeling better and having a lower BP 136/66. Patient woke up feeling nauseated, weak and tired. BP this am 166/67, took Metoprolol 1.'5mg'$  again 1 hr later 138/64. Patient does also report some anxiety and has been struggling with it lately. Patient used to take propanolol but it was stopped during his recent hospitalization. He states that for 2-3 days his blood pressure was great but has since started to raise again.  Pulse has remained good.

## 2022-01-30 NOTE — Telephone Encounter (Signed)
Pt returning a call, he ask that you call home phone

## 2022-01-31 ENCOUNTER — Telehealth (HOSPITAL_BASED_OUTPATIENT_CLINIC_OR_DEPARTMENT_OTHER): Payer: Self-pay | Admitting: Cardiology

## 2022-01-31 NOTE — Telephone Encounter (Signed)
Pt c/o BP issue: STAT if pt c/o blurred vision, one-sided weakness or slurred speech  1. What are your last 5 BP readings?   142/70  HR 67 168/87  HR 65 147/64  HR 42 (had taken med) 148/70  HR 67  2. Are you having any other symptoms (ex. Dizziness, headache, blurred vision, passed out)?   Nauseous and jittery  3. What is your BP issue?   Patient called stating his blood pressure has been very high when he wakes up in the morning.

## 2022-01-31 NOTE — Telephone Encounter (Signed)
Returned call to patient who reports concerns about elevated Bp and heart rate.  Pt states he increased his metoprolol to an additional 12.5 mg for a total of 37.'5mg'$ .  Pt takes his Bp multiple times throughout the day.  He is calling to see "how Dr. Harrell Gave feels about the increase he made to his metoprolol dose, and his blood pressure/pulse. Writer attempted to explain patient should take his Bp once daily 30 to 45 minutes after taking his metoprolol. Pt states he know when his pressure is high and he checks his Bp.  Writer spoke with Dr. Harrell Gave who requests patient only take Metoprolol 25 mg daily as prescribed, checks his Bp once a day as writer directed, brings his readings and bp monitor to his appt. Next week.  Explained to patient his monitor will not be accurate if he is having a lot of irregular heartbeats.  Pt was able to verbally repeat instructions given and agreeable to following them.  Georgana Curio MHA RN CCM

## 2022-02-01 NOTE — Telephone Encounter (Signed)
Please see updated encounter  

## 2022-02-05 ENCOUNTER — Ambulatory Visit (INDEPENDENT_AMBULATORY_CARE_PROVIDER_SITE_OTHER): Payer: Medicare Other

## 2022-02-05 ENCOUNTER — Encounter (HOSPITAL_BASED_OUTPATIENT_CLINIC_OR_DEPARTMENT_OTHER): Payer: Self-pay | Admitting: Cardiology

## 2022-02-05 ENCOUNTER — Ambulatory Visit (HOSPITAL_BASED_OUTPATIENT_CLINIC_OR_DEPARTMENT_OTHER): Payer: Medicare Other | Admitting: Cardiology

## 2022-02-05 VITALS — BP 190/86 | HR 83 | Ht 67.0 in | Wt 179.5 lb

## 2022-02-05 DIAGNOSIS — R001 Bradycardia, unspecified: Secondary | ICD-10-CM

## 2022-02-05 DIAGNOSIS — I10 Essential (primary) hypertension: Secondary | ICD-10-CM

## 2022-02-05 DIAGNOSIS — Z09 Encounter for follow-up examination after completed treatment for conditions other than malignant neoplasm: Secondary | ICD-10-CM

## 2022-02-05 DIAGNOSIS — I493 Ventricular premature depolarization: Secondary | ICD-10-CM

## 2022-02-05 NOTE — Progress Notes (Signed)
Cardiology Office Note:    Date:  02/05/2022   ID:  Markez Dowland, DOB 29-Apr-1941, MRN [id number]  PCP:  Shirline Frees, MD  Cardiologist:  Buford Dresser, MD  Referring MD: Shirline Frees, MD   CC: post hospital follow up  History of Present Illness:    John Nelson is a 81 y.o. male with a hx of PVCs, hypertension, and hyperlipidemia, who is seen for post-hospital follow-up.  He was admitted to the hospital 01/21/2022 after presenting with dizziness and nausea. He also reported increasing blood pressures in the previous 2 weeks, along with heart rates in the 30's and 40's. His propranolol was discontinued. Cardiology was consulted. He was switched to metoprolol due to his PVC burden. His Echo showed LVEF 55-60% and frequent PVCs. TSH was normal. He was recommended for close follow-up with cardiology as an outpatient.  On 01/31/2022 he called the office reporting concerns for elevated blood pressure and heart rate. He had increased his metoprolol to an additional 12.5 mg for a total of 37.5 mg, and was checking his BP multiple times daily. He was instructed to only take 25 mg metoprolol as prescribed, and check his blood pressure once daily 30-45 minutes after taking his medication.  He presents today for 14 day post discharge appointment. We reviewed his hospitalization and medication list today.  Felt great the first two days he was out of the hospital. After that, started to feel worse. Feels terrible in the morning. Blood pressures have been running high, mostly in the AM (checks between 3-5 AM some times). Most have been 150s/80s on average but has had some as high as 010U systolic. Wakes up nauseated, weak, nervous every morning. Used to walk before breakfast but now doesn't feel up to it. Takes 2 doses of clonazepam every night (around midnight and around 3 pm).  Weakness, nausea have been intermittent and limiting. He tried taking 1.5 pills (37.5 mg metoprolol), BP came down,  felt much better. He has self dosed intermittently with extra metoprolol since then. Initially this improved his blood pressure and kept his HR in the 60s. However, after several days, BP and HR were labile again.   We reviewed his extensive home log of BP and HR today. He has noticed more heart racing than heart being slow. He has been able to walk 30 minutes most days, though he doesn't feel like his prior baseline.   He typically takes metoprolol in the AM, checks his BP/HR about an hour later. Also checks when he feels poorly.   Has had jitteriness, sweaty palms, feelings of nervousness. TSH, fT4 normal in the hospital.  We discussed options today, see below.  Past Medical History:  Diagnosis Date   High cholesterol    Hypertension     No past surgical history on file.  Current Medications: Current Outpatient Medications on File Prior to Visit  Medication Sig   aspirin EC 81 MG tablet Take 81 mg by mouth every other day. Swallow whole.   clonazePAM (KLONOPIN) 0.5 MG tablet Take 0.25-0.5 mg by mouth See admin instructions. Take 0.25 mg at bedtime and at 2 AM- may take an additional 0.25-0.5 mg once a day as needed for anxiety   metoprolol succinate (TOPROL-XL) 25 MG 24 hr tablet Take 1 tablet (25 mg total) by mouth daily.   pravastatin (PRAVACHOL) 40 MG tablet Take 20 mg by mouth at bedtime.   No current facility-administered medications on file prior to visit.     Allergies:  Patient has no known allergies.   Social History   Tobacco Use   Smoking status: Never  Substance Use Topics   Alcohol use: No    Family History: No family history of premature cad  ROS:   Please see the history of present illness.  All other systems are reviewed and negative.    EKGs/Labs/Other Studies Reviewed:    The following studies were reviewed today:  Echocardiogram  01/22/2022: Sonographer Comments: Image acquisition challenging due to respiratory  motion.  IMPRESSIONS    1.  Left ventricular ejection fraction, by estimation, is 55 to 60%. The  left ventricle has normal function. The left ventricle has no regional  wall motion abnormalities. Left ventricular diastolic parameters are  consistent with Grade I diastolic  dysfunction (impaired relaxation).   2. Right ventricular systolic function is normal. The right ventricular  size is normal. Tricuspid regurgitation signal is inadequate for assessing  PA pressure.   3. The mitral valve is normal in structure. Trivial mitral valve  regurgitation.   4. The aortic valve is tricuspid. Aortic valve regurgitation is not  visualized. No aortic stenosis is present.   5. The inferior vena cava is normal in size with greater than 50%  respiratory variability, suggesting right atrial pressure of 3 mmHg.   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 The PVCs are  ineffective (there is virtually no left ventricular or right ventricular  output with the PVCs). This may mimic bradycardia if a mechanical means of pulse counting is used (e.g. manual palpation, pulse oximetry, automatic sphygmomanometry, etc.).   EKG:  EKG is personally reviewed.   02/05/2022:  sinus rhythm in ventricular bigeminy, HR 83 bpm  Recent Labs: 01/21/2022: TSH 2.161 01/23/2022: ALT 15; BUN 18; Creatinine, Ser 1.15; Hemoglobin 13.3; Magnesium 1.8; Platelets 197; Potassium 3.5; Sodium 140   Recent Lipid Panel    Component Value Date/Time   CHOL 150 01/23/2022 0352   TRIG 87 01/23/2022 0352   HDL 36 (L) 01/23/2022 0352   CHOLHDL 4.2 01/23/2022 0352   VLDL 17 01/23/2022 0352   LDLCALC 97 01/23/2022 0352    Physical Exam:    VS:  BP (!) 190/86 (BP Location: Left Arm, Patient Position: Sitting, Cuff Size: Normal)   Pulse 83   Ht '5\' 7"'$  (1.702 m)   Wt 179 lb 8 oz (81.4 kg)   BMI 28.11 kg/m  Orthostatics: Lying 155/71, HR 72 Sitting 177/88, HR 89 Standing  150/78, HR 86 Standing 3 min 156/72, HR 86    Wt Readings from Last 3 Encounters:  01/21/22 179 lb (81.2 kg)    GEN: Well nourished, well developed in no acute distress HEENT: Normal, moist mucous membranes NECK: No JVD CARDIAC: regular rhythm, normal S1 and S2, no rubs or gallops. No murmur. VASCULAR: Radial and DP pulses 2+ bilaterally. No carotid bruits RESPIRATORY:  Clear to auscultation without rales, wheezing or rhonchi  ABDOMEN: Soft, non-tender, non-distended MUSCULOSKELETAL:  Ambulates independently SKIN: Warm and dry, no edema NEUROLOGIC:  Alert and oriented x 3. No focal neuro deficits noted. PSYCHIATRIC:  Normal affect    ASSESSMENT:    1. Hospital discharge follow-up   2. Frequent PVCs   3. Primary hypertension   4. Bradycardia    PLAN:    14 day hospital discharge follow up PVCS -Recommended 25 mg metoprolol succinate in the AM and 12.5 mg (1/2 pill) metoprolol succinate in the PM for a few days. If not  feeling better, increase to 25 mg in both the AM and PM.  -will place monitor today, instructed on use. Need to confirm PVC burden, make sure there are no significant pauses/bradycardia -there has not been evidence thus far that he has symptomatic bradycardia (reason for admission). No indication for pacemaker at this time, but will review monitor  Cardiac risk counseling and prevention recommendations: -recommend heart healthy/Mediterranean diet, with whole grains, fruits, vegetable, fish, lean meats, nuts, and olive oil. Limit salt. -recommend moderate walking, 3-5 times/week for 30-50 minutes each session. Aim for at least 150 minutes.week. Goal should be pace of 3 miles/hours, or walking 1.5 miles in 30 minutes -recommend avoidance of tobacco products. Avoid excess alcohol.  Plan for follow up: 3 weeks or sooner as needed.  Buford Dresser, MD, PhD, [facility]    Medication Adjustments/Labs and Tests Ordered: Current medicines  are reviewed at length with the patient today.  Concerns regarding medicines are outlined above.   Orders Placed This Encounter  Procedures   LONG TERM MONITOR-LIVE TELEMETRY (3-14 DAYS)   EKG 12-Lead   No orders of the defined types were placed in this encounter.  Patient Instructions  Medication Instructions:  Take 25 mg metoprolol succinate in the AM and 12.5 mg (1/2 pill) metoprolol succinate in the PM for a few days. If you don't feel better, increase to 25 mg in both the AM and PM.   We will have you wear a monitor during this time to make sure you aren't having pauses or dangerously low heart rates. It will also tell us how many early beats you are having.   *If you need a refill on your cardiac medications before your next appointment, please call your pharmacy*   Lab Work: None ordered today   Testing/Procedures: We will have you wear a monitor during this time to make sure you aren't having pauses or dangerously low heart rates. It will also tell us how many early beats you are having.    Your physician has recommended that you wear a 7 day Zio AT Live monitor.   This monitor is a medical device that records the heart's electrical activity. Doctors most often use these monitors to diagnose arrhythmias. Arrhythmias are problems with the speed or rhythm of the heartbeat. The monitor is a small device applied to your chest. You can wear one while you do your normal daily activities. While wearing this monitor if you have any symptoms to push the button and record what you felt. Once you have worn this monitor for the period of time provider prescribed (Usually 14 days), you will return the monitor device in the postage paid box/bag. Once it is returned they will download the data collected and provide [place] with a report which the provider will then review and we will call you with those results.   Important tips:  Avoid showering during the first 24 hours of wearing the  monitor. Avoid excessive sweating to help maximize wear time. Do not submerge the device, no hot tubs, and no swimming pools. Keep any lotions or oils away from the patch. After 24 hours you may shower with the patch on. Take brief showers with your back facing the shower head.  Do not remove patch once it has been placed because that will interrupt data and decrease adhesive wear time. Push the button when you have any symptoms and write down what you were feeling. Once you have completed wearing your monitor,  remove and place into box which has postage paid and place in your outgoing mailbox.  If for some reason you have misplaced your box then call our office and we can provide another box and/or mail it off for you. Keep the transmitter within 10 feet at all times.  Expect a welcome phone call within 52-77 hrs of application from [place].  This call will include your copay information, so please answer any unknown phone calls while wearing Zio (it could also be important information about your heart) The envelope to return Zio is in the back of the transmitter. Removal instructions are on the last page of the symptom diary.  Place the patch sticky side up inside the transmitter and the symptom diary inside the envelope to return on your last wear day inside your mailbox or any USPS mailbox.     Follow-Up: At [facility], you and your health needs are our priority.  As part of our continuing mission to provide you with exceptional heart care, we have created designated Provider Care Teams.  These Care Teams include your primary Cardiologist (physician) and Advanced Practice Providers (APPs -  Physician Assistants and Nurse Practitioners) who all work together to provide you with the care you need, when you need it.  We recommend signing up for the patient portal called "MyChart".  Sign up information is provided on this After Visit Summary.  MyChart is used to connect with patients for Virtual  Visits (Telemedicine).  Patients are able to view lab/test results, encounter notes, upcoming appointments, etc.  Non-urgent messages can be sent to your provider as well.   To learn more about what you can do with MyChart, go to [email or web address].    Your next appointment:   3 week(s)  The format for your next appointment:   In Person  Provider:   Buford Dresser, MD{      I,Mathew Stumpf,acting as a scribe for Buford Dresser, MD.,have documented all relevant documentation on the behalf of Buford Dresser, MD,as directed by  Buford Dresser, MD while in the presence of Buford Dresser, MD.  I, Buford Dresser, MD, have reviewed all documentation for this visit. The documentation on 03/10/22 for the exam, diagnosis, procedures, and orders are all accurate and complete.   Signed, Buford Dresser, MD PhD 02/05/2022     [place]

## 2022-02-05 NOTE — Patient Instructions (Addendum)
Medication Instructions:  Take 25 mg metoprolol succinate in the AM and 12.5 mg (1/2 pill) metoprolol succinate in the PM for a few days. If you don't feel better, increase to 25 mg in both the AM and PM.   We will have you wear a monitor during this time to make sure you aren't having pauses or dangerously low heart rates. It will also tell us how many early beats you are having.   *If you need a refill on your cardiac medications before your next appointment, please call your pharmacy*   Lab Work: None ordered today   Testing/Procedures: We will have you wear a monitor during this time to make sure you aren't having pauses or dangerously low heart rates. It will also tell us how many early beats you are having.    Your physician has recommended that you wear a 7 day Zio AT Live monitor.   This monitor is a medical device that records the heart's electrical activity. Doctors most often use these monitors to diagnose arrhythmias. Arrhythmias are problems with the speed or rhythm of the heartbeat. The monitor is a small device applied to your chest. You can wear one while you do your normal daily activities. While wearing this monitor if you have any symptoms to push the button and record what you felt. Once you have worn this monitor for the period of time provider prescribed (Usually 14 days), you will return the monitor device in the postage paid box/bag. Once it is returned they will download the data collected and provide [place] with a report which the provider will then review and we will call you with those results.   Important tips:  Avoid showering during the first 24 hours of wearing the monitor. Avoid excessive sweating to help maximize wear time. Do not submerge the device, no hot tubs, and no swimming pools. Keep any lotions or oils away from the patch. After 24 hours you may shower with the patch on. Take brief showers with your back facing the shower head.  Do not remove patch  once it has been placed because that will interrupt data and decrease adhesive wear time. Push the button when you have any symptoms and write down what you were feeling. Once you have completed wearing your monitor, remove and place into box which has postage paid and place in your outgoing mailbox.  If for some reason you have misplaced your box then call our office and we can provide another box and/or mail it off for you. Keep the transmitter within 10 feet at all times.  Expect a welcome phone call within 71-69 hrs of application from [place].  This call will include your copay information, so please answer any unknown phone calls while wearing Zio (it could also be important information about your heart) The envelope to return Zio is in the back of the transmitter. Removal instructions are on the last page of the symptom diary.  Place the patch sticky side up inside the transmitter and the symptom diary inside the envelope to return on your last wear day inside your mailbox or any USPS mailbox.     Follow-Up: At [facility], you and your health needs are our priority.  As part of our continuing mission to provide you with exceptional heart care, we have created designated Provider Care Teams.  These Care Teams include your primary Cardiologist (physician) and Advanced Practice Providers (APPs -  Physician Assistants and Nurse Practitioners) who all work together  to provide you with the care you need, when you need it.  We recommend signing up for the patient portal called "MyChart".  Sign up information is provided on this After Visit Summary.  MyChart is used to connect with patients for Virtual Visits (Telemedicine).  Patients are able to view lab/test results, encounter notes, upcoming appointments, etc.  Non-urgent messages can be sent to your provider as well.   To learn more about what you can do with MyChart, go to [email or web address].    Your next appointment:   3 week(s)  The  format for your next appointment:   In Person  Provider:   Buford Dresser, MD{

## 2022-02-06 DIAGNOSIS — R001 Bradycardia, unspecified: Secondary | ICD-10-CM | POA: Diagnosis not present

## 2022-02-12 ENCOUNTER — Telehealth: Payer: Self-pay | Admitting: Cardiology

## 2022-02-12 MED ORDER — METOPROLOL SUCCINATE ER 25 MG PO TB24: 25.0000 mg | ORAL_TABLET | Freq: Two times a day (BID) | ORAL | 1 refills | Status: DC

## 2022-02-12 NOTE — Telephone Encounter (Addendum)
Patient Instructions by Buford Dresser, MD at 02/05/2022 11:40 AM  Author: Buford Dresser, MD Author Type: Physician Filed: 02/05/2022 12:50 PM  Note Status: Signed Cosign: Cosign Not Required Encounter Date: 02/05/2022  Editor: Buford Dresser, MD (Physician)                       Plan: Take 25 mg metoprolol succinate in the AM and 12.5 mg (1/2 pill) metoprolol succinate in the PM for a few days. If you don't feel better, increase to 25 mg in both the AM and PM.    We will have you wear a monitor during this time to make sure you aren't having pauses or dangerously low heart rates. It will also tell us how many early beats you are having.         Above from Dr Christopher's visit   Left message to call back to discuss increase to 25 mg twice a day, want to advise to  monitor blood pressure and HR   Did send Rx at twice a day so he would not be without medication

## 2022-02-12 NOTE — Telephone Encounter (Signed)
Pt c/o medication issue:  1. Name of Medication: metoprolol succinate (TOPROL-XL) 25 MG 24 hr tablet  2. How are you currently taking this medication (dosage and times per day)? Take 25 mg metoprolol succinate in the AM and 12.5 mg (1/2 pill) metoprolol succinate in the PM for a few days. If you don't feel better, increase to 25 mg in both the AM and PM.   3. Are you having a reaction (difficulty breathing--STAT)? no  4. What is your medication issue? Pt states he is out of this medication but the pharmacy won't refill medication unless new prescription is sent. Pt states he was taking 25 mg in the AM and 12.'5mg'$  in the PM but now he wants to increase to '25mg'$  in the AM and PM as told.

## 2022-02-13 NOTE — Telephone Encounter (Signed)
Pt returning nurse's call. Please advise

## 2022-02-13 NOTE — Telephone Encounter (Signed)
Spoke with patient and he has decided to keep the Toprol 25 at 1 tablet in am and 1/2 in pm  He asked that I not change Rx prior to follow up in case he needs to go to 25 mg twice a day prior to 9/13 f/u Advised patient to monitor blood pressurea/HR at home, log and bring both machine and log to follow up  Patient verbalized understanding

## 2022-02-27 ENCOUNTER — Ambulatory Visit (HOSPITAL_BASED_OUTPATIENT_CLINIC_OR_DEPARTMENT_OTHER): Payer: Medicare Other | Admitting: Cardiology

## 2022-02-27 ENCOUNTER — Encounter (HOSPITAL_BASED_OUTPATIENT_CLINIC_OR_DEPARTMENT_OTHER): Payer: Self-pay | Admitting: Cardiology

## 2022-02-27 VITALS — BP 156/91 | HR 67 | Ht 67.0 in | Wt 180.0 lb

## 2022-02-27 DIAGNOSIS — I493 Ventricular premature depolarization: Secondary | ICD-10-CM | POA: Diagnosis not present

## 2022-02-27 DIAGNOSIS — E7800 Pure hypercholesterolemia, unspecified: Secondary | ICD-10-CM | POA: Diagnosis not present

## 2022-02-27 DIAGNOSIS — I10 Essential (primary) hypertension: Secondary | ICD-10-CM

## 2022-02-27 DIAGNOSIS — Z712 Person consulting for explanation of examination or test findings: Secondary | ICD-10-CM | POA: Diagnosis not present

## 2022-02-27 MED ORDER — PROPRANOLOL HCL 10 MG PO TABS: 10.0000 mg | ORAL_TABLET | Freq: Two times a day (BID) | ORAL | 3 refills | Status: AC

## 2022-02-27 NOTE — Patient Instructions (Signed)
Medication Instructions:  STOP: Metoprolol START: Propranolol 10 mg twice a day (Please call pharmacy when you need refill)  *If you need a refill on your cardiac medications before your next appointment, please call your pharmacy*   Lab Work: None ordered today   Testing/Procedures: None ordered today   Follow-Up: At [facility], you and your health needs are our priority.  As part of our continuing mission to provide you with exceptional heart care, we have created designated Provider Care Teams.  These Care Teams include your primary Cardiologist (physician) and Advanced Practice Providers (APPs -  Physician Assistants and Nurse Practitioners) who all work together to provide you with the care you need, when you need it.  We recommend signing up for the patient portal called "MyChart".  Sign up information is provided on this After Visit Summary.  MyChart is used to connect with patients for Virtual Visits (Telemedicine).  Patients are able to view lab/test results, encounter notes, upcoming appointments, etc.  Non-urgent messages can be sent to your provider as well.   To learn more about what you can do with MyChart, go to [email or web address].    Your next appointment:   2 month(s)  The format for your next appointment:   In Person  Provider:   Buford Dresser, MD

## 2022-02-27 NOTE — Progress Notes (Signed)
Cardiology Office Note:    Date:  02/27/2022   ID:  John Nelson, DOB 25-May-1941, MRN [id number]  PCP:  Shirline Frees, MD  Cardiologist:  Buford Dresser, MD  Referring MD: Shirline Frees, MD   CC: follow up  History of Present Illness:    John Nelson is a 81 y.o. male with a hx of PVCs, hypertension, and hyperlipidemia, who is seen for follow-up.  He was admitted to the hospital 01/21/2022 after presenting with dizziness and nausea. He also reported increasing blood pressures in the previous 2 weeks, along with heart rates in the 30's and 40's. His propranolol was discontinued. Cardiology was consulted. He was switched to metoprolol due to his PVC burden. His Echo showed LVEF 55-60% and frequent PVCs. TSH was normal. He was recommended for close follow-up with cardiology as an outpatient.  Today: He says he has been feeling terribly for the past three weeks. He states that taking his metoprolol has been awful. He has experienced fatigue and nausea frequently as a result. When he takes his dose of metoprolol, he says it goes "straight to my heart." He expresses that when he was on propranolol he felt great all around.   He denies any chest pain, shortness of breath, or peripheral edema. No lightheadedness, headaches, syncope, orthopnea, or PND.   Past Medical History:  Diagnosis Date   High cholesterol    Hypertension     No past surgical history on file.  Current Medications: Current Outpatient Medications on File Prior to Visit  Medication Sig   aspirin EC 81 MG tablet Take 81 mg by mouth every other day. Swallow whole.   clonazePAM (KLONOPIN) 0.5 MG tablet Take 0.25-0.5 mg by mouth See admin instructions. Take 0.25 mg at bedtime and at 2 AM- may take an additional 0.25-0.5 mg once a day as needed for anxiety   metoprolol succinate (TOPROL-XL) 25 MG 24 hr tablet Take 25 mg by mouth as directed. TAKE 1 TABLET IN AM AND 1/2 IN PM   pravastatin (PRAVACHOL) 40 MG tablet  Take 20 mg by mouth at bedtime.   No current facility-administered medications on file prior to visit.     Allergies:   Patient has no known allergies.   Social History   Tobacco Use   Smoking status: Never  Substance Use Topics   Alcohol use: No    Family History: No history of premature cad  ROS:   Please see the history of present illness. All other systems are reviewed and negative.    EKGs/Labs/Other Studies Reviewed:    The following studies were reviewed today:  Echocardiogram  01/22/2022: Sonographer Comments: Image acquisition challenging due to respiratory  motion.  IMPRESSIONS    1. Left ventricular ejection fraction, by estimation, is 55 to 60%. The  left ventricle has normal function. The left ventricle has no regional  wall motion abnormalities. Left ventricular diastolic parameters are  consistent with Grade I diastolic  dysfunction (impaired relaxation).   2. Right ventricular systolic function is normal. The right ventricular  size is normal. Tricuspid regurgitation signal is inadequate for assessing  PA pressure.   3. The mitral valve is normal in structure. Trivial mitral valve  regurgitation.   4. The aortic valve is tricuspid. Aortic valve regurgitation is not  visualized. No aortic stenosis is present.   5. The inferior vena cava is normal in size with greater than 50%  respiratory variability, suggesting right atrial pressure of 3 mmHg.   Conclusion(s)/Recommendation(s): There are  very frequent monomorphic PVCs, with almost incessant bigeminy or trigeminy pattern. The PVCs are  ineffective (there is virtually no left ventricular or right ventricular  output with the PVCs). This may mimic bradycardia if a mechanical means of pulse counting is used (e.g. manual palpation, pulse oximetry, automatic sphygmomanometry, etc.).   EKG:  EKG is personally reviewed.   02/27/22: not ordered today 02/05/2022:  sinus rhythm in ventricular bigeminy, HR 83  bpm  Recent Labs: 01/21/2022: TSH 2.161 01/23/2022: ALT 15; BUN 18; Creatinine, Ser 1.15; Hemoglobin 13.3; Magnesium 1.8; Platelets 197; Potassium 3.5; Sodium 140   Recent Lipid Panel    Component Value Date/Time   CHOL 150 01/23/2022 0352   TRIG 87 01/23/2022 0352   HDL 36 (L) 01/23/2022 0352   CHOLHDL 4.2 01/23/2022 0352   VLDL 17 01/23/2022 0352   LDLCALC 97 01/23/2022 0352    Physical Exam:    VS:  BP (!) 156/91 (BP Location: Right Arm, Patient Position: Sitting, Cuff Size: Normal)   Pulse 67   Ht '5\' 7"'$  (1.702 m)   Wt 180 lb (81.6 kg)   SpO2 98%   BMI 28.19 kg/m     Wt Readings from Last 3 Encounters:  02/27/22 180 lb (81.6 kg)  02/05/22 179 lb 8 oz (81.4 kg)  01/21/22 179 lb (81.2 kg)    GEN: Well nourished, well developed in no acute distress HEENT: Normal, moist mucous membranes NECK: No JVD CARDIAC: regular rhythm with intermittent PVCs, normal S1 and S2, no rubs or gallops. No murmur. VASCULAR: Radial and DP pulses 2+ bilaterally. No carotid bruits RESPIRATORY:  Clear to auscultation without rales, wheezing or rhonchi  ABDOMEN: Soft, non-tender, non-distended MUSCULOSKELETAL:  Ambulates independently SKIN: Warm and dry, no edema NEUROLOGIC:  Alert and oriented x 3. No focal neuro deficits noted. PSYCHIATRIC:  Normal affect    ASSESSMENT:    1. PVC (premature ventricular contraction)   2. Primary hypertension   3. Pure hypercholesterolemia   4. Encounter to discuss test results    PLAN:    PVCs Hypertension -there has not been evidence thus far that he has symptomatic bradycardia (reason for admission). No indication for pacemaker at this time, but will review monitor -he feels terrible on the metoprolol. We discussed options at length today. After shared decision making, will restart propranolol, which he tolerated well -he will contact me with any issues or concerns -continue to monitor home BP and heart rate -reviewed red flag warning signs that need  immediate medical attention -reviewed his monitor test results today  Hypercholesterolemia -continue statin -aspirin per patient preference (not recommended by guidelines)  Cardiac risk counseling and prevention recommendations: -recommend heart healthy/Mediterranean diet, with whole grains, fruits, vegetable, fish, lean meats, nuts, and olive oil. Limit salt. -recommend moderate walking, 3-5 times/week for 30-50 minutes each session. Aim for at least 150 minutes.week. Goal should be pace of 3 miles/hours, or walking 1.5 miles in 30 minutes -recommend avoidance of tobacco products. Avoid excess alcohol.  Plan for follow up: 2 months.  Buford Dresser, MD, PhD, [facility]    Medication Adjustments/Labs and Tests Ordered: Current medicines are reviewed at length with the patient today.  Concerns regarding medicines are outlined above.   No orders of the defined types were placed in this encounter.  Meds ordered this encounter  Medications   propranolol (INDERAL) 10 MG tablet    Sig: Take 1 tablet (10 mg total) by mouth 2 (two) times daily.  Dispense:  180 tablet    Refill:  3    Please wait to fill until patient calls   Patient Instructions  Medication Instructions:  STOP: Metoprolol START: Propranolol 10 mg twice a day (Please call pharmacy when you need refill)  *If you need a refill on your cardiac medications before your next appointment, please call your pharmacy*   Lab Work: None ordered today   Testing/Procedures: None ordered today   Follow-Up: At [facility], you and your health needs are our priority.  As part of our continuing mission to provide you with exceptional heart care, we have created designated Provider Care Teams.  These Care Teams include your primary Cardiologist (physician) and Advanced Practice Providers (APPs -  Physician Assistants and Nurse Practitioners) who all work together to provide you with the  care you need, when you need it.  We recommend signing up for the patient portal called "MyChart".  Sign up information is provided on this After Visit Summary.  MyChart is used to connect with patients for Virtual Visits (Telemedicine).  Patients are able to view lab/test results, encounter notes, upcoming appointments, etc.  Non-urgent messages can be sent to your provider as well.   To learn more about what you can do with MyChart, go to [email or web address].    Your next appointment:   2 month(s)  The format for your next appointment:   In Person  Provider:   Buford Dresser, MD             I,Breanna Adamick,acting as a scribe for Buford Dresser, MD.,have documented all relevant documentation on the behalf of Buford Dresser, MD,as directed by  Buford Dresser, MD while in the presence of Buford Dresser, MD.   I, Buford Dresser, MD, have reviewed all documentation for this visit. The documentation on 03/18/22 for the exam, diagnosis, procedures, and orders are all accurate and complete.   Signed, Buford Dresser, MD PhD 02/27/2022     [place]

## 2022-03-10 ENCOUNTER — Encounter (HOSPITAL_BASED_OUTPATIENT_CLINIC_OR_DEPARTMENT_OTHER): Payer: Self-pay | Admitting: Cardiology

## 2022-03-18 ENCOUNTER — Encounter (HOSPITAL_BASED_OUTPATIENT_CLINIC_OR_DEPARTMENT_OTHER): Payer: Self-pay | Admitting: Cardiology

## 2022-04-01 DIAGNOSIS — I10 Essential (primary) hypertension: Secondary | ICD-10-CM | POA: Diagnosis not present

## 2022-04-01 DIAGNOSIS — Z0000 Encounter for general adult medical examination without abnormal findings: Secondary | ICD-10-CM | POA: Diagnosis not present

## 2022-04-29 ENCOUNTER — Ambulatory Visit (HOSPITAL_BASED_OUTPATIENT_CLINIC_OR_DEPARTMENT_OTHER): Payer: Medicare Other | Admitting: Cardiology

## 2022-04-29 ENCOUNTER — Other Ambulatory Visit (HOSPITAL_BASED_OUTPATIENT_CLINIC_OR_DEPARTMENT_OTHER): Payer: Self-pay

## 2022-04-29 VITALS — BP 126/58 | HR 52 | Ht 67.0 in | Wt 178.0 lb

## 2022-04-29 DIAGNOSIS — R001 Bradycardia, unspecified: Secondary | ICD-10-CM | POA: Diagnosis not present

## 2022-04-29 DIAGNOSIS — I10 Essential (primary) hypertension: Secondary | ICD-10-CM

## 2022-04-29 DIAGNOSIS — I493 Ventricular premature depolarization: Secondary | ICD-10-CM | POA: Diagnosis not present

## 2022-04-29 DIAGNOSIS — E7800 Pure hypercholesterolemia, unspecified: Secondary | ICD-10-CM | POA: Diagnosis not present

## 2022-04-29 MED ORDER — COMIRNATY 30 MCG/0.3ML IM SUSY
PREFILLED_SYRINGE | INTRAMUSCULAR | 0 refills | Status: AC
  Filled 2022-04-29: qty 0.3, 1d supply, fill #0

## 2022-04-29 NOTE — Patient Instructions (Signed)
Medication Instructions:  Your physician recommends that you continue on your current medications as directed. Please refer to the Current Medication list given to you today.   *If you need a refill on your cardiac medications before your next appointment, please call your pharmacy*  Lab Work: NONE  Testing/Procedures: NONE   Follow-Up: At [facility], you and your health needs are our priority.  As part of our continuing mission to provide you with exceptional heart care, we have created designated Provider Care Teams.  These Care Teams include your primary Cardiologist (physician) and Advanced Practice Providers (APPs -  Physician Assistants and Nurse Practitioners) who all work together to provide you with the care you need, when you need it.  We recommend signing up for the patient portal called "MyChart".  Sign up information is provided on this After Visit Summary.  MyChart is used to connect with patients for Virtual Visits (Telemedicine).  Patients are able to view lab/test results, encounter notes, upcoming appointments, etc.  Non-urgent messages can be sent to your provider as well.   To learn more about what you can do with MyChart, go to [email or web address].    Your next appointment:   6 month(s)  The format for your next appointment:   In Person  Provider:   Bridgette Christopher, MD             

## 2022-04-29 NOTE — Progress Notes (Signed)
Cardiology Office Note:    Date:  04/29/2022   ID:  John Nelson, DOB 01-Aug-1940, MRN [id number]  PCP:  Shirline Frees, MD  Cardiologist:  Buford Dresser, MD  Referring MD: Shirline Frees, MD   CC: follow up  History of Present Illness:    John Nelson is a 81 y.o. male with a hx of PVCs, hypertension, and hyperlipidemia, who is seen for follow-up.  He was admitted to the hospital 01/21/2022 after presenting with dizziness and nausea. He also reported increasing blood pressures in the previous 2 weeks, along with heart rates in the 30's and 40's. His propranolol was discontinued. Cardiology was consulted. He was switched to metoprolol due to his PVC burden. His Echo showed LVEF 55-60% and frequent PVCs. TSH was normal. He was recommended for close follow-up with cardiology as an outpatient.  At his last appointment, he was feeling terribly. He complained of fatigue and nausea due to taking metoprolol. He had felt better on propranolol. After shared decision making, we restarted propranolol.  Today: He is feeling great with no recurring pains or other concerning issues. Generally he is feeling better when he is active. Recently he worked on cleaning the gutters and raking leaves. He did become fatigued but this was not as severe as it had been. He was not weak, short of breath, or nauseous. On days he doesn't work outside he walks for 30 minutes and performs other at-home exercises.   Occasionally he does monitor his pulse rate at home, which is around 48-52 bpm. He notices some skipped beats, but not as frequently as in the past.  In clinic his blood pressure is elevated at 146/94, which he attributes to white coat hypertension. He presents a blood pressure log today which is personally reviewed. Most of his readings are 335K to 562B systolic, with the highest reading 143/70.  His wife cooks most of their meals at home. He may order out once a week.  This year he has received  his Flu and RSV vaccinations. He has usually received Pfizer vaccines for his COVID booster, but will be unable to obtain this at his pharmacy. We discussed in detail about recommendations regarding the Moderna vaccine.  He denies any chest pain, or peripheral edema. No lightheadedness, headaches, syncope, orthopnea, or PND.   Past Medical History:  Diagnosis Date   High cholesterol    Hypertension     No past surgical history on file.  Current Medications: Current Outpatient Medications on File Prior to Visit  Medication Sig   aspirin EC 81 MG tablet Take 81 mg by mouth every other day. Swallow whole.   clonazePAM (KLONOPIN) 0.5 MG tablet Take 0.25-0.5 mg by mouth See admin instructions. Take 0.25 mg at bedtime and at 2 AM- may take an additional 0.25-0.5 mg once a day as needed for anxiety   pravastatin (PRAVACHOL) 40 MG tablet Take 20 mg by mouth at bedtime.   propranolol (INDERAL) 10 MG tablet Take 1 tablet (10 mg total) by mouth 2 (two) times daily.   No current facility-administered medications on file prior to visit.     Allergies:   Metoprolol   Social History   Tobacco Use   Smoking status: Never  Substance Use Topics   Alcohol use: No    Family History: No history of premature cad  ROS:   Please see the history of present illness. (+) Fatigue All other systems are reviewed and negative.    EKGs/Labs/Other Studies Reviewed:  The following studies were reviewed today:  Monitor 02/2022: Patch Wear Time:  7 days and 2 hours   Patient had a min HR of 40 bpm, max HR of 174 bpm, and avg HR of 59 bpm. Predominant underlying rhythm was Sinus Rhythm. 20 Ventricular Tachycardia runs occurred, the run with the fastest interval lasting 4 beats with a max rate of 174 bpm, the longest lasting 6 beats with an avg rate of 102 bpm. 17 Supraventricular Tachycardia runs occurred, the run with the fastest interval lasting 6 beats with a max rate of 158 bpm, the longest lasting  17.1 secs with an avg rate of 120 bpm.  Isolated SVEs were rare (<1.0%). Isolated VEs were frequent (15.1%, 91828), VE Couplets were frequent (9.2%, 09326), and VE Triplets were rare (<1.0%, 1584). Ventricular Bigeminy and Trigeminy were present, longest episodes <2 minutes. No patient triggered events.  Echocardiogram  01/22/2022: Sonographer Comments: Image acquisition challenging due to respiratory  motion.  IMPRESSIONS    1. Left ventricular ejection fraction, by estimation, is 55 to 60%. The  left ventricle has normal function. The left ventricle has no regional  wall motion abnormalities. Left ventricular diastolic parameters are  consistent with Grade I diastolic  dysfunction (impaired relaxation).   2. Right ventricular systolic function is normal. The right ventricular  size is normal. Tricuspid regurgitation signal is inadequate for assessing  PA pressure.   3. The mitral valve is normal in structure. Trivial mitral valve  regurgitation.   4. The aortic valve is tricuspid. Aortic valve regurgitation is not  visualized. No aortic stenosis is present.   5. The inferior vena cava is normal in size with greater than 50%  respiratory variability, suggesting right atrial pressure of 3 mmHg.   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 The PVCs are  ineffective (there is virtually no left ventricular or right ventricular  output with the PVCs). This may mimic bradycardia if a mechanical means of pulse counting is used (e.g. manual palpation, pulse oximetry, automatic sphygmomanometry, etc.).   EKG:  EKG is personally reviewed.   04/29/2022: sinus bradycardia at 52 bpm with PVC 02/27/22: not ordered 02/05/2022:  sinus rhythm in ventricular bigeminy, HR 83 bpm  Recent Labs: 01/21/2022: TSH 2.161 01/23/2022: ALT 15; BUN 18; Creatinine, Ser 1.15; Hemoglobin 13.3; Magnesium 1.8; Platelets 197; Potassium 3.5;  Sodium 140   Recent Lipid Panel    Component Value Date/Time   CHOL 150 01/23/2022 0352   TRIG 87 01/23/2022 0352   HDL 36 (L) 01/23/2022 0352   CHOLHDL 4.2 01/23/2022 0352   VLDL 17 01/23/2022 0352   LDLCALC 97 01/23/2022 0352    Physical Exam:    VS:  BP (!) 126/58 Comment: home number  Pulse (!) 52   Ht '5\' 7"'$  (1.702 m)   Wt 178 lb (80.7 kg)   BMI 27.88 kg/m     Wt Readings from Last 3 Encounters:  04/29/22 178 lb (80.7 kg)  02/27/22 180 lb (81.6 kg)  02/05/22 179 lb 8 oz (81.4 kg)    GEN: Well nourished, well developed in no acute distress HEENT: Normal, moist mucous membranes NECK: No JVD CARDIAC: regular rhythm with intermittent PVCs, normal S1 and S2, no rubs or gallops. No murmur. VASCULAR: Radial and DP pulses 2+ bilaterally. No carotid bruits RESPIRATORY:  Clear to auscultation without rales, wheezing or rhonchi  ABDOMEN: Soft, non-tender, non-distended MUSCULOSKELETAL:  Ambulates independently SKIN: Warm and dry, no edema NEUROLOGIC:  Alert and oriented x 3. No focal neuro deficits noted. PSYCHIATRIC:  Normal affect    ASSESSMENT:    1. PVC (premature ventricular contraction)   2. Primary hypertension   3. Pure hypercholesterolemia   4. Bradycardia     PLAN:    PVCs Hypertension Bradycardia -feels much better on propranolol instead of metoprolol -see prior notes/hospitalization. Bradycardia likely incidental, not cause of symptoms -reviewed monitor results -he will contact me with any issues or concerns -continue to monitor home BP and heart rate. Blood pressure at goal today -reviewed red flag warning signs that need immediate medical attention  Hypercholesterolemia -continue statin -aspirin per patient preference (not recommended by guidelines)  Cardiac risk counseling and prevention recommendations: -recommend heart healthy/Mediterranean diet, with whole grains, fruits, vegetable, fish, lean meats, nuts, and olive oil. Limit  salt. -recommend moderate walking, 3-5 times/week for 30-50 minutes each session. Aim for at least 150 minutes.week. Goal should be pace of 3 miles/hours, or walking 1.5 miles in 30 minutes -recommend avoidance of tobacco products. Avoid excess alcohol.  Plan for follow up: 6 months or sooner as needed.  Buford Dresser, MD, PhD, [facility]    Medication Adjustments/Labs and Tests Ordered: Current medicines are reviewed at length with the patient today.  Concerns regarding medicines are outlined above.   Orders Placed This Encounter  Procedures   EKG 12-Lead   No orders of the defined types were placed in this encounter.  Patient Instructions  Medication Instructions:  Your physician recommends that you continue on your current medications as directed. Please refer to the Current Medication list given to you today.   *If you need a refill on your cardiac medications before your next appointment, please call your pharmacy*  Lab Work: NONE  Testing/Procedures: NONE  Follow-Up: At [facility] you and your health needs are our priority.  As part of our continuing mission to provide you with exceptional heart care, we have created designated Provider Care Teams.  These Care Teams include your primary Cardiologist (physician) and Advanced Practice Providers (APPs -  Physician Assistants and Nurse Practitioners) who all work together to provide you with the care you need, when you need it.  We recommend signing up for the patient portal called "MyChart".  Sign up information is provided on this After Visit Summary.  MyChart is used to connect with patients for Virtual Visits (Telemedicine).  Patients are able to view lab/test results, encounter notes, upcoming appointments, etc.  Non-urgent messages can be sent to your provider as well.   To learn more about what you can do with MyChart, go to [email or web address].    Your next appointment:   6  month(s)  The format for your next appointment:   In Person  Provider:   Buford Dresser, MD                [facility] Stumpf,acting as a scribe for Buford Dresser, MD.,have documented all relevant documentation on the behalf of Buford Dresser, MD,as directed by  Buford Dresser, MD while in the presence of Buford Dresser, MD.  I, Buford Dresser, MD, have reviewed all documentation for this visit. The documentation on 04/29/22 for the exam, diagnosis, procedures, and orders are all accurate and complete.   Signed, Buford Dresser, MD PhD 04/29/2022     [place]

## 2022-04-30 DIAGNOSIS — D2271 Melanocytic nevi of right lower limb, including hip: Secondary | ICD-10-CM | POA: Diagnosis not present

## 2022-04-30 DIAGNOSIS — X32XXXD Exposure to sunlight, subsequent encounter: Secondary | ICD-10-CM | POA: Diagnosis not present

## 2022-04-30 DIAGNOSIS — Z08 Encounter for follow-up examination after completed treatment for malignant neoplasm: Secondary | ICD-10-CM | POA: Diagnosis not present

## 2022-04-30 DIAGNOSIS — D225 Melanocytic nevi of trunk: Secondary | ICD-10-CM | POA: Diagnosis not present

## 2022-04-30 DIAGNOSIS — Z85820 Personal history of malignant melanoma of skin: Secondary | ICD-10-CM | POA: Diagnosis not present

## 2022-04-30 DIAGNOSIS — Z1283 Encounter for screening for malignant neoplasm of skin: Secondary | ICD-10-CM | POA: Diagnosis not present

## 2022-04-30 DIAGNOSIS — L820 Inflamed seborrheic keratosis: Secondary | ICD-10-CM | POA: Diagnosis not present

## 2022-04-30 DIAGNOSIS — D485 Neoplasm of uncertain behavior of skin: Secondary | ICD-10-CM | POA: Diagnosis not present

## 2022-04-30 DIAGNOSIS — L570 Actinic keratosis: Secondary | ICD-10-CM | POA: Diagnosis not present

## 2022-05-08 ENCOUNTER — Encounter (HOSPITAL_BASED_OUTPATIENT_CLINIC_OR_DEPARTMENT_OTHER): Payer: Self-pay | Admitting: Cardiology

## 2022-05-24 DIAGNOSIS — L570 Actinic keratosis: Secondary | ICD-10-CM | POA: Diagnosis not present

## 2022-05-24 DIAGNOSIS — X32XXXD Exposure to sunlight, subsequent encounter: Secondary | ICD-10-CM | POA: Diagnosis not present

## 2022-05-24 DIAGNOSIS — L988 Other specified disorders of the skin and subcutaneous tissue: Secondary | ICD-10-CM | POA: Diagnosis not present

## 2022-05-24 DIAGNOSIS — D485 Neoplasm of uncertain behavior of skin: Secondary | ICD-10-CM | POA: Diagnosis not present

## 2022-10-01 DIAGNOSIS — I10 Essential (primary) hypertension: Secondary | ICD-10-CM | POA: Diagnosis not present

## 2022-10-01 DIAGNOSIS — E7800 Pure hypercholesterolemia, unspecified: Secondary | ICD-10-CM | POA: Diagnosis not present

## 2022-11-05 ENCOUNTER — Ambulatory Visit (HOSPITAL_BASED_OUTPATIENT_CLINIC_OR_DEPARTMENT_OTHER): Payer: Medicare Other | Admitting: Cardiology

## 2022-12-31 ENCOUNTER — Other Ambulatory Visit (HOSPITAL_COMMUNITY): Payer: Self-pay

## 2023-01-21 DIAGNOSIS — L570 Actinic keratosis: Secondary | ICD-10-CM | POA: Diagnosis not present

## 2023-01-21 DIAGNOSIS — X32XXXD Exposure to sunlight, subsequent encounter: Secondary | ICD-10-CM | POA: Diagnosis not present

## 2023-01-21 DIAGNOSIS — L820 Inflamed seborrheic keratosis: Secondary | ICD-10-CM | POA: Diagnosis not present

## 2023-01-21 DIAGNOSIS — L905 Scar conditions and fibrosis of skin: Secondary | ICD-10-CM | POA: Diagnosis not present

## 2023-04-01 DIAGNOSIS — H43393 Other vitreous opacities, bilateral: Secondary | ICD-10-CM | POA: Diagnosis not present

## 2023-04-21 DIAGNOSIS — Z0000 Encounter for general adult medical examination without abnormal findings: Secondary | ICD-10-CM | POA: Diagnosis not present

## 2023-04-21 DIAGNOSIS — E7800 Pure hypercholesterolemia, unspecified: Secondary | ICD-10-CM | POA: Diagnosis not present

## 2023-04-21 DIAGNOSIS — I10 Essential (primary) hypertension: Secondary | ICD-10-CM | POA: Diagnosis not present

## 2023-04-21 DIAGNOSIS — Z23 Encounter for immunization: Secondary | ICD-10-CM | POA: Diagnosis not present

## 2023-04-22 DIAGNOSIS — Z1283 Encounter for screening for malignant neoplasm of skin: Secondary | ICD-10-CM | POA: Diagnosis not present

## 2023-04-22 DIAGNOSIS — X32XXXD Exposure to sunlight, subsequent encounter: Secondary | ICD-10-CM | POA: Diagnosis not present

## 2023-04-22 DIAGNOSIS — D485 Neoplasm of uncertain behavior of skin: Secondary | ICD-10-CM | POA: Diagnosis not present

## 2023-04-22 DIAGNOSIS — Z08 Encounter for follow-up examination after completed treatment for malignant neoplasm: Secondary | ICD-10-CM | POA: Diagnosis not present

## 2023-04-22 DIAGNOSIS — Z85820 Personal history of malignant melanoma of skin: Secondary | ICD-10-CM | POA: Diagnosis not present

## 2023-04-22 DIAGNOSIS — L570 Actinic keratosis: Secondary | ICD-10-CM | POA: Diagnosis not present

## 2023-04-22 DIAGNOSIS — D225 Melanocytic nevi of trunk: Secondary | ICD-10-CM | POA: Diagnosis not present

## 2023-07-31 DIAGNOSIS — L570 Actinic keratosis: Secondary | ICD-10-CM | POA: Diagnosis not present

## 2023-07-31 DIAGNOSIS — C44222 Squamous cell carcinoma of skin of right ear and external auricular canal: Secondary | ICD-10-CM | POA: Diagnosis not present

## 2023-07-31 DIAGNOSIS — X32XXXD Exposure to sunlight, subsequent encounter: Secondary | ICD-10-CM | POA: Diagnosis not present

## 2023-09-09 DIAGNOSIS — X32XXXD Exposure to sunlight, subsequent encounter: Secondary | ICD-10-CM | POA: Diagnosis not present

## 2023-09-09 DIAGNOSIS — L820 Inflamed seborrheic keratosis: Secondary | ICD-10-CM | POA: Diagnosis not present

## 2023-09-09 DIAGNOSIS — Z85828 Personal history of other malignant neoplasm of skin: Secondary | ICD-10-CM | POA: Diagnosis not present

## 2023-09-09 DIAGNOSIS — C4441 Basal cell carcinoma of skin of scalp and neck: Secondary | ICD-10-CM | POA: Diagnosis not present

## 2023-09-09 DIAGNOSIS — L570 Actinic keratosis: Secondary | ICD-10-CM | POA: Diagnosis not present

## 2023-09-09 DIAGNOSIS — Z08 Encounter for follow-up examination after completed treatment for malignant neoplasm: Secondary | ICD-10-CM | POA: Diagnosis not present

## 2023-10-20 DIAGNOSIS — I10 Essential (primary) hypertension: Secondary | ICD-10-CM | POA: Diagnosis not present

## 2023-10-20 DIAGNOSIS — I493 Ventricular premature depolarization: Secondary | ICD-10-CM | POA: Diagnosis not present

## 2023-10-20 DIAGNOSIS — J301 Allergic rhinitis due to pollen: Secondary | ICD-10-CM | POA: Diagnosis not present

## 2023-10-20 DIAGNOSIS — E7800 Pure hypercholesterolemia, unspecified: Secondary | ICD-10-CM | POA: Diagnosis not present

## 2023-10-28 DIAGNOSIS — L570 Actinic keratosis: Secondary | ICD-10-CM | POA: Diagnosis not present

## 2023-10-28 DIAGNOSIS — C4441 Basal cell carcinoma of skin of scalp and neck: Secondary | ICD-10-CM | POA: Diagnosis not present

## 2023-10-28 DIAGNOSIS — X32XXXD Exposure to sunlight, subsequent encounter: Secondary | ICD-10-CM | POA: Diagnosis not present

## 2023-12-09 DIAGNOSIS — L570 Actinic keratosis: Secondary | ICD-10-CM | POA: Diagnosis not present

## 2023-12-09 DIAGNOSIS — Z85828 Personal history of other malignant neoplasm of skin: Secondary | ICD-10-CM | POA: Diagnosis not present

## 2023-12-09 DIAGNOSIS — X32XXXD Exposure to sunlight, subsequent encounter: Secondary | ICD-10-CM | POA: Diagnosis not present

## 2023-12-09 DIAGNOSIS — Z08 Encounter for follow-up examination after completed treatment for malignant neoplasm: Secondary | ICD-10-CM | POA: Diagnosis not present

## 2024-04-05 DIAGNOSIS — H43393 Other vitreous opacities, bilateral: Secondary | ICD-10-CM | POA: Diagnosis not present
